# Patient Record
Sex: Male | Born: 1965
Health system: Southern US, Community
[De-identification: ages and names within clinical notes are randomized; demographics above are authoritative.]

## PROBLEM LIST (undated history)

## (undated) DIAGNOSIS — I10 Essential (primary) hypertension: Secondary | ICD-10-CM

## (undated) HISTORY — PX: OTHER SURGICAL HISTORY: SHX169

## (undated) HISTORY — PX: ANKLE SURGERY: SHX546

## (undated) HISTORY — PX: HERNIA REPAIR: SHX51

---

## 2007-11-12 HISTORY — PX: ROTATOR CUFF REPAIR: SHX139

## 2008-04-15 ENCOUNTER — Ambulatory Visit: Payer: Self-pay | Admitting: Family Medicine

## 2008-06-02 ENCOUNTER — Ambulatory Visit (HOSPITAL_BASED_OUTPATIENT_CLINIC_OR_DEPARTMENT_OTHER): Admission: RE | Admit: 2008-06-02 | Discharge: 2008-06-03 | Payer: Self-pay | Admitting: Orthopedic Surgery

## 2011-03-26 NOTE — Op Note (Signed)
[name], Guzman                ACCOUNT NO.:  [id number]   MEDICAL RECORD NO.:  [id number]          PATIENT TYPE:  AMB   LOCATION:  DSC                          FACILITY:  [facility]   PHYSICIAN:  Katy Fitch. Sypher, M.D. DATE OF BIRTH:  1966/06/12   DATE OF PROCEDURE:  06/02/2008  DATE OF DISCHARGE:  06/03/2008                               OPERATIVE REPORT   PREOPERATIVE DIAGNOSES:  Chronic pain, right shoulder with MRI evidence  of acromioclavicular joint arthropathy, edema of distal clavicle, and  significant subscapularis tendinopathy.   POSTOPERATIVE DIAGNOSES:  Type 1 superior labrum anterior and posterior  lesion with some undermining of insertion of long head of biceps at  superior labrum, grade 3 subscapularis degenerative tear with  retraction, acromioclavicular joint degenerative arthritis, and signs of  chronic stage II impingement, right shoulder.   OPERATION:  1. Examination of right shoulder under anesthesia.  2. Diagnostic arthroscopy, right shoulder with arthroscopic      debridement of type 1 superior labrum anterior and posterior lesion      and decortication of superior glenoid to facilitate healing of      minor instability of superior labrum/biceps origin.  3. Arthroscopic repair of subscapularis grade 3 tear with      decortication of lesser tuberosity and placement of a 5.5-mm PEEK      Bio-Corkscrew anchor utilizing double mattress technique.  4. Subacromial decompression with bursectomy, coracoacromial ligament      relaxation, and acromioplasty.  5. Arthroscopic distal clavicle resection.   OPERATIONS:  Katy Fitch. Sypher, MD   ASSISTANT:  Marveen Reeks Dasnoit, PA-C   ANESTHESIA:  General by LMA, supplemented by right interscalene block.   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   He has had a long career of heavy lifting.   He has had a chronic history of  right shoulder pain.  He was evaluated  by his primary care physician, Dr. Sharlot Gowda who is an expert in  primary care sports medicine.  Dr. Susann Givens noted evidence of [facility]  degenerative arthritis and sent him for an MRI.  The MRI demonstrated  significant subscapularis tendinopathy.  Mark Guzman was subsequently  referred for an upper extremity orthopedic consult in June 2009.  Clinical examination revealed signs of impingement, AC arthropathy, and  discomfort with stressing of the rotator cuff, particularly with  internal rotation.   The MRI was reviewed and was notable for tendinopathy without evidence  of retracted rotator cuff tear.  Our initial impression was significant  AC arthropathy with impingement.  We advised proceeding with diagnostic  arthroscopy followed by appropriate repairs.   Preoperatively, Mark Guzman was prepared for possible rotator cuff  repair, should we find evidence of a retracted tear.   He was seen in consultation by Dr. Gypsy Balsam in the holding area and  subsequently had an interscalene block placed on the right without  complication.  Excellent anesthesia of the right arm and forequarter  were achieved.  He subsequently completed informed consent.  He  was  brought to room #1, placed in supine position on the operating table,  and under Dr. Burnett Corrente strict supervision, general endotracheal  anesthesia induced.  He was carefully positioned in a beach-chair  position with aid of a torso and head holder designed for shoulder  arthroscopy.  The right arm was prepped with DuraPrep and draped with  impervious arthroscopy drapes.  The scope was introduced through a  standard posterior viewing portal using a switching stick from anterior.  Diagnostic arthroscopy confirmed a type 1 SLAP lesion and some  instability at the biceps origin.  The subscapularis was noted to have a  grade 3 tear with considerable synovitis and fibular degeneration of the  superior 70% of the  tendon.  There was a comma sign noted.   The deep surface of the supraspinatus, infraspinatus, and teres minor  were examined and found to be fundamentally intact.  The long head of  biceps had minor synovitis at the rotator interval and in the bicipital  groove.   An anterior portal was created under direct vision followed by use of a  4.5-mm suction shaver to debride the labrum and to decorticate the  superior glenoid to facilitate healing of the labrum to the glenoid.  We  did not perform a formal SLAP repair as this was a grade 1+ SLAP lesion.  The peel-back test was marginal.   The subscapularis was subsequently prepared for repair by creation of an  anterolateral portal, use of a suction shaver to debride to a smooth  tendon margin.  The lesser tuberosity was decorticated with a suction  shaver followed by placement of a 5.5-mm PEEK Bio-Corkscrew anchor at  the superior aspect of the subscap.   Two mattress sutures were placed with a Scorpion suture passer followed  by internal rotation of the arm and careful tying of the tendon with a  sixth finger knot passer.  There was some challenge to get a strong  repair due to the fibular degeneration of the tendon, however,  ultimately excellent approximation to the decorticated tuberosity was  achieved.   The scope was then removed from the glenohumeral joint and placed in the  subacromial space.  A considerable degree of bursitis was noted.  The [facility]  joint was prominent.  After debridement of bursa and tenolysis of the  cuff, the acromion was leveled to a type 1 morphology.  The  coracoacromial ligament was relaxed and electrocauterized, particularly  at the acromial branch.  The capsule of the [facility] joint was resected  followed by resection of the distal 15-mm clavicle with the arthroscopic  bur.   There were no apparent complications.  After hemostasis was achieved,  the subacromial space was thoroughly debrided with a suction  shaver  followed by removal of the arthroscopic equipment.  The portals were  repaired with mattress suture of 3-0 Prolene.  Mark Guzman was placed in  a sling and transferred to the recovery room with stable signs.  He will  be admitted to [facility] overnight due to the placement of anchors  and the rotator cuff repair.  There were no apparent complications.      Katy Fitch Sypher, M.D.  Electronically Signed    RVS/MEDQ  D:  06/02/2008  T:  06/03/2008  Job:  [id number]   cc:   Sharlot Gowda, M.D.

## 2011-08-09 LAB — POCT HEMOGLOBIN-HEMACUE: Hemoglobin: 17.2 — ABNORMAL HIGH

## 2014-03-18 ENCOUNTER — Encounter: Payer: Self-pay | Admitting: *Deleted

## 2015-07-10 ENCOUNTER — Emergency Department (HOSPITAL_COMMUNITY)
Admission: EM | Admit: 2015-07-10 | Discharge: 2015-07-10 | Disposition: A | Discharge status: Home / Self Care | Payer: BLUE CROSS/BLUE SHIELD | Attending: Emergency Medicine | Admitting: Emergency Medicine

## 2015-07-10 ENCOUNTER — Encounter (HOSPITAL_COMMUNITY): Payer: Self-pay | Admitting: *Deleted

## 2015-07-10 DIAGNOSIS — J302 Other seasonal allergic rhinitis: Secondary | ICD-10-CM | POA: Diagnosis not present

## 2015-07-10 DIAGNOSIS — Z720 Tobacco use: Secondary | ICD-10-CM | POA: Diagnosis not present

## 2015-07-10 DIAGNOSIS — R0981 Nasal congestion: Secondary | ICD-10-CM | POA: Diagnosis present

## 2015-07-10 DIAGNOSIS — J309 Allergic rhinitis, unspecified: Secondary | ICD-10-CM

## 2015-07-10 DIAGNOSIS — J9801 Acute bronchospasm: Secondary | ICD-10-CM | POA: Insufficient documentation

## 2015-07-10 MED ORDER — PREDNISONE 50 MG PO TABS
60.0000 mg | ORAL_TABLET | Freq: Once | ORAL | Status: AC
  Administered 2015-07-10: 60 mg via ORAL
  Filled 2015-07-10 (×2): qty 1

## 2015-07-10 MED ORDER — MOMETASONE FUROATE 50 MCG/ACT NA SUSP: 4.0000 | Freq: Every day | NASAL | Status: DC

## 2015-07-10 MED ORDER — PREDNISONE 50 MG PO TABS: 50.0000 mg | ORAL_TABLET | Freq: Every day | ORAL | Status: DC

## 2015-07-10 MED ORDER — IPRATROPIUM-ALBUTEROL 0.5-2.5 (3) MG/3ML IN SOLN
3.0000 mL | Freq: Once | RESPIRATORY_TRACT | Status: AC
  Administered 2015-07-10: 3 mL via RESPIRATORY_TRACT
  Filled 2015-07-10: qty 3

## 2015-07-10 MED ORDER — ALBUTEROL SULFATE HFA 108 (90 BASE) MCG/ACT IN AERS: 2.0000 | INHALATION_SPRAY | RESPIRATORY_TRACT | Status: DC | PRN

## 2015-07-10 NOTE — Discharge Instructions (Signed)
Use Afrin nasal spray as needed, but do not use for more than three days. Take Claritin or Zyrtec once a day.  Allergic Rhinitis Allergic rhinitis is when the mucous membranes in the nose respond to allergens. Allergens are particles in the air that cause your body to have an allergic reaction. This causes you to release allergic antibodies. Through a chain of events, these eventually cause you to release histamine into the blood stream. Although meant to protect the body, it is this release of histamine that causes your discomfort, such as frequent sneezing, congestion, and an itchy, runny nose.  CAUSES  Seasonal allergic rhinitis (hay fever) is caused by pollen allergens that may come from grasses, trees, and weeds. Year-round allergic rhinitis (perennial allergic rhinitis) is caused by allergens such as house dust mites, pet dander, and mold spores.  SYMPTOMS   Nasal stuffiness (congestion).  Itchy, runny nose with sneezing and tearing of the eyes. DIAGNOSIS  Your health care provider can help you determine the allergen or allergens that trigger your symptoms. If you and your health care provider are unable to determine the allergen, skin or blood testing may be used. TREATMENT  Allergic rhinitis does not have a cure, but it can be controlled by:  Medicines and allergy shots (immunotherapy).  Avoiding the allergen. Hay fever may often be treated with antihistamines in pill or nasal spray forms. Antihistamines block the effects of histamine. There are over-the-counter medicines that may help with nasal congestion and swelling around the eyes. Check with your health care provider before taking or giving this medicine.  If avoiding the allergen or the medicine prescribed do not work, there are many new medicines your health care provider can prescribe. Stronger medicine may be used if initial measures are ineffective. Desensitizing injections can be used if medicine and avoidance does not work.  Desensitization is when a patient is given ongoing shots until the body becomes less sensitive to the allergen. Make sure you follow up with your health care provider if problems continue. HOME CARE INSTRUCTIONS It is not possible to completely avoid allergens, but you can reduce your symptoms by taking steps to limit your exposure to them. It helps to know exactly what you are allergic to so that you can avoid your specific triggers. SEEK MEDICAL CARE IF:   You have a fever.  You develop a cough that does not stop easily (persistent).  You have shortness of breath.  You start wheezing.  Symptoms interfere with normal daily activities. Document Released: 07/23/2001 Document Revised: 11/02/2013 Document Reviewed: 07/05/2013 [facility] Patient Information 2015 [place], [place]. This information is not intended to replace advice given to you by your health care provider. Make sure you discuss any questions you have with your health care provider.  Bronchospasm A bronchospasm is a spasm or tightening of the airways going into the lungs. During a bronchospasm breathing becomes more difficult because the airways get smaller. When this happens there can be coughing, a whistling sound when breathing (wheezing), and difficulty breathing. Bronchospasm is often associated with asthma, but not all patients who experience a bronchospasm have asthma. CAUSES  A bronchospasm is caused by inflammation or irritation of the airways. The inflammation or irritation may be triggered by:   Allergies (such as to animals, pollen, food, or mold). Allergens that cause bronchospasm may cause wheezing immediately after exposure or many hours later.   Infection. Viral infections are believed to be the most common cause of bronchospasm.   Exercise.  Irritants (such as pollution, cigarette smoke, strong odors, aerosol sprays, and paint fumes).   Weather changes. Winds increase molds and pollens in the air. Rain  refreshes the air by washing irritants out. Cold air may cause inflammation.   Stress and emotional upset.  SIGNS AND SYMPTOMS   Wheezing.   Excessive nighttime coughing.   Frequent or severe coughing with a simple cold.   Chest tightness.   Shortness of breath.  DIAGNOSIS  Bronchospasm is usually diagnosed through a history and physical exam. Tests, such as chest X-rays, are sometimes done to look for other conditions. TREATMENT   Inhaled medicines can be given to open up your airways and help you breathe. The medicines can be given using either an inhaler or a nebulizer machine.  Corticosteroid medicines may be given for severe bronchospasm, usually when it is associated with asthma. HOME CARE INSTRUCTIONS   Always have a plan prepared for seeking medical care. Know when to call your health care provider and local emergency services (911 in the [place] Know where you can access local emergency care.  Only take medicines as directed by your health care provider.  If you were prescribed an inhaler or nebulizer machine, ask your health care provider to explain how to use it correctly. Always use a spacer with your inhaler if you were given one.  It is necessary to remain calm during an attack. Try to relax and breathe more slowly.  Control your home environment in the following ways:   Change your heating and air conditioning filter at least once a month.   Limit your use of fireplaces and wood stoves.  Do not smoke and do not allow smoking in your home.   Avoid exposure to perfumes and fragrances.   Get rid of pests (such as roaches and mice) and their droppings.   Throw away plants if you see mold on them.   Keep your house clean and dust free.   Replace carpet with wood, tile, or vinyl flooring. Carpet can trap dander and dust.   Use allergy-proof pillows, mattress covers, and box spring covers.   Wash bed sheets and blankets every week in hot  water and dry them in a dryer.   Use blankets that are made of polyester or cotton.   Wash hands frequently. SEEK MEDICAL CARE IF:   You have muscle aches.   You have chest pain.   The sputum changes from clear or white to yellow, green, gray, or bloody.   The sputum you cough up gets thicker.   There are problems that may be related to the medicine you are given, such as a rash, itching, swelling, or trouble breathing.  SEEK IMMEDIATE MEDICAL CARE IF:   You have worsening wheezing and coughing even after taking your prescribed medicines.   You have increased difficulty breathing.   You develop severe chest pain. MAKE SURE YOU:   Understand these instructions.  Will watch your condition.  Will get help right away if you are not doing well or get worse. Document Released: 10/31/2003 Document Revised: 11/02/2013 Document Reviewed: 04/19/2013 [facility] Patient Information 2015 [place], [place]. This information is not intended to replace advice given to you by your health care provider. Make sure you discuss any questions you have with your health care provider.  Prednisolone tablets What is this medicine? PREDNISOLONE (pred NISS oh lone) is a corticosteroid. It is commonly used to treat inflammation of the skin, joints, lungs, and other organs. Common conditions treated  include asthma, allergies, and arthritis. It is also used for other conditions, such as blood disorders and diseases of the adrenal glands. This medicine may be used for other purposes; ask your health care provider or pharmacist if you have questions. COMMON BRAND NAME(S): Millipred, Millipred DP, Millipred DP 12-Day, Millipred DP 6 Day, Prednoral What should I tell my health care provider before I take this medicine? They need to know if you have any of these conditions: -Cushing's syndrome -diabetes -glaucoma -heart problems or disease -high blood pressure -infection such as herpes, measles,  tuberculosis, or chickenpox -kidney disease -liver disease -mental problems -myasthenia gravis -osteoporosis -seizures -stomach ulcer or intestine disease including colitis and diverticulitis -thyroid problem -an unusual or allergic reaction to lactose, prednisolone, other medicines, foods, dyes, or preservatives -pregnant or trying to get pregnant -breast-feeding How should I use this medicine? Take this medicine by mouth with a glass of water. Follow the directions on the prescription label. Take it with food or milk to avoid stomach upset. If you are taking this medicine once a day, take it in the morning. Do not take more medicine than you are told to take. Do not suddenly stop taking your medicine because you may develop a severe reaction. Your doctor will tell you how much medicine to take. If your doctor wants you to stop the medicine, the dose may be slowly lowered over time to avoid any side effects. Talk to your pediatrician regarding the use of this medicine in children. Special care may be needed. Overdosage: If you think you have taken too much of this medicine contact a poison control center or emergency room at once. NOTE: This medicine is only for you. Do not share this medicine with others. What if I miss a dose? If you miss a dose, take it a soon as you can. If it is almost time for your next dose, talk to your doctor or health care professional. You may need to miss a dose or take an extra dose. Do not take double or extra doses without advice. What may interact with this medicine? Do not take this medicine with any of the following medications: -mifepristone This medicine may also interact with the following medications: -aspirin -phenobarbital -phenytoin -rifampin -vaccines -warfarin This list may not describe all possible interactions. Give your health care provider a list of all the medicines, herbs, non-prescription drugs, or dietary supplements you use. Also tell  them if you smoke, drink alcohol, or use illegal drugs. Some items may interact with your medicine. What should I watch for while using this medicine? Visit your doctor or health care professional for regular checks on your progress. If you are taking this medicine over a prolonged period, carry an identification card with your name and address, the type and dose of your medicine, and your doctor's name and address. The medicine may increase your risk of getting an infection. Stay away from people who are sick. Tell your doctor or health care professional if you are around anyone with measles or chickenpox. If you are going to have surgery, tell your doctor or health care professional that you have taken this medicine within the last twelve months. Ask your doctor or health care professional about your diet. You may need to lower the amount of salt you eat. The medicine can increase your blood sugar. If you are a diabetic check with your doctor if you need help adjusting the dose of your diabetic medicine. What side effects may I notice  from receiving this medicine? Side effects that you should report to your doctor or health care professional as soon as possible: -eye pain, decreased or blurred vision, or bulging eyes -fever, sore throat, sneezing, cough, or other signs of infection, wounds that will not heal -frequent passing of urine -increased thirst -mental depression, mood swings, mistaken feelings of self importance or of being mistreated -pain in hips, back, ribs, arms, shoulders, or legs -swelling of feet or lower legs Side effects that usually do not require medical attention (report to your doctor or health care professional if they continue or are bothersome): -confusion, excitement, restlessness -headache -nausea, vomiting -skin problems, acne, thin and shiny skin -weight gain This list may not describe all possible side effects. Call your doctor for medical advice about side  effects. You may report side effects to FDA at 1-800-FDA-1088. Where should I keep my medicine? Keep out of the reach of children. Store at room temperature between 15 and 30 degrees C (59 and 86 degrees F). Keep container tightly closed. Throw away any unused medicine after the expiration date. NOTE: This sheet is a summary. It may not cover all possible information. If you have questions about this medicine, talk to your doctor, pharmacist, or health care provider.  2015, [employer] Standard. (2012-07-28 11:43:16)  Albuterol inhalation aerosol What is this medicine? ALBUTEROL (al [place]) is a bronchodilator. It helps open up the airways in your lungs to make it easier to breathe. This medicine is used to treat and to prevent bronchospasm. This medicine may be used for other purposes; ask your health care provider or pharmacist if you have questions. COMMON BRAND NAME(S): Proair HFA, Proventil, Proventil HFA, Respirol, Ventolin, Ventolin HFA What should I tell my health care provider before I take this medicine? They need to know if you have any of the following conditions: -diabetes -heart disease or irregular heartbeat -high blood pressure -pheochromocytoma -seizures -thyroid disease -an unusual or allergic reaction to albuterol, levalbuterol, sulfites, other medicines, foods, dyes, or preservatives -pregnant or trying to get pregnant -breast-feeding How should I use this medicine? This medicine is for inhalation through the mouth. Follow the directions on your prescription label. Take your medicine at regular intervals. Do not use more often than directed. Make sure that you are using your inhaler correctly. Ask you doctor or health care provider if you have any questions. Talk to your pediatrician regarding the use of this medicine in children. Special care may be needed. Overdosage: If you think you have taken too much of this medicine contact a poison control center or  emergency room at once. NOTE: This medicine is only for you. Do not share this medicine with others. What if I miss a dose? If you miss a dose, use it as soon as you can. If it is almost time for your next dose, use only that dose. Do not use double or extra doses. What may interact with this medicine? -anti-infectives like chloroquine and pentamidine -caffeine -cisapride -diuretics -medicines for colds -medicines for depression or for emotional or psychotic conditions -medicines for weight loss including some herbal products -methadone -some antibiotics like clarithromycin, erythromycin, levofloxacin, and linezolid -some heart medicines -steroid hormones like dexamethasone, cortisone, hydrocortisone -theophylline -thyroid hormones This list may not describe all possible interactions. Give your health care provider a list of all the medicines, herbs, non-prescription drugs, or dietary supplements you use. Also tell them if you smoke, drink alcohol, or use illegal drugs. Some items may interact with your  medicine. What should I watch for while using this medicine? Tell your doctor or health care professional if your symptoms do not improve. Do not use extra albuterol. If your asthma or bronchitis gets worse while you are using this medicine, call your doctor right away. If your mouth gets dry try chewing sugarless gum or sucking hard candy. Drink water as directed. What side effects may I notice from receiving this medicine? Side effects that you should report to your doctor or health care professional as soon as possible: -allergic reactions like skin rash, itching or hives, swelling of the face, lips, or tongue -breathing problems -chest pain -feeling faint or lightheaded, falls -high blood pressure -irregular heartbeat -fever -muscle cramps or weakness -pain, tingling, numbness in the hands or feet -vomiting Side effects that usually do not require medical attention (report to  your doctor or health care professional if they continue or are bothersome): -cough -difficulty sleeping -headache -nervousness or trembling -stomach upset -stuffy or runny nose -throat irritation -unusual taste This list may not describe all possible side effects. Call your doctor for medical advice about side effects. You may report side effects to FDA at 1-800-FDA-1088. Where should I keep my medicine? Keep out of the reach of children. Store at room temperature between 15 and 30 degrees C (59 and 86 degrees F). The contents are under pressure and may burst when exposed to heat or flame. Do not freeze. This medicine does not work as well if it is too cold. Throw away any unused medicine after the expiration date. Inhalers need to be thrown away after the labeled number of puffs have been used or by the expiration date; whichever comes first. Ventolin HFA should be thrown away 12 months after removing from foil pouch. Check the instructions that come with your medicine. NOTE: This sheet is a summary. It may not cover all possible information. If you have questions about this medicine, talk to your doctor, pharmacist, or health care provider.  2015, [employer] Standard. (2013-04-15 10:57:17)  Mometasone nasal spray What is this medicine? MOMETASONE(moe MET a sone) is a corticosteroid. It helps decrease inflammation in your nose. This medicine is used to treat the symptoms of allergies like sneezing, itching, and runny or stuffy nose. This medicine is also used to treat nasal polyps. This medicine may be used for other purposes; ask your health care provider or pharmacist if you have questions. COMMON BRAND NAME(S): Nasonex What should I tell my health care provider before I take this medicine? They need to know if you have any of these conditions: -infection, like tuberculosis, herpes, or fungal infection -recent surgery or injury of nose or sinuses -taking corticosteroids by mouth -an  unusual or allergic reaction to mometasone, other corticosteroids, medicines, foods, dyes, or preservatives -pregnant or trying to get pregnant -breast-feeding How should I use this medicine? This medicine is for use in the nose. Follow the directions on your prescription label. Do not use more often than directed. Do not share this medicine with anyone else. Make sure that you are using your nasal spray correctly. Ask you doctor or health care provider if you have any questions. Talk to your pediatrician regarding the use of this medicine in children. While this drug may be prescribed for children as young as 67 years of age for selected conditions, precautions do apply. Overdosage: If you think you have taken too much of this medicine contact a poison control center or emergency room at once. NOTE: This medicine is  only for you. Do not share this medicine with others. What if I miss a dose? If you miss a dose, use it as soon as you can. If it is almost time for your next dose, use only that dose and continue with your regular schedule. Do not use double or extra doses. What may interact with this medicine? There are no known drug interactions. Check with your doctor before you use any other medicine for your nose or sinus. This list may not describe all possible interactions. Give your health care provider a list of all the medicines, herbs, non-prescription drugs, or dietary supplements you use. Also tell them if you smoke, drink alcohol, or use illegal drugs. Some items may interact with your medicine. What should I watch for while using this medicine? Visit your doctor for regular check ups. Tell your doctor or healthcare professional if your symptoms do not start to get better or if they get worse. This medicine may increase your risk of getting an infection. Tell your doctor or health care professional if you are around anyone with measles or chickenpox, or if you develop sores or blisters that  do not heal properly. What side effects may I notice from receiving this medicine? Side effects that you should report to your doctor or health care professional as soon as possible: -allergic reactions like skin rash, itching or hives, swelling of the face, lips, or tongue -breathing problems -changes in vision -feeling faint or lightheaded, falls -infection -nausea, vomiting -unusually weak or tired -white patches or sores in the mouth or nose Side effects that usually do not require medical attention (report to your doctor or health care professional if they continue or are bothersome): -altered sense of taste or smell -burning or irritation inside the nose or throat -cough -headache -muscle pain -painful menstrual periods -nosebleed This list may not describe all possible side effects. Call your doctor for medical advice about side effects. You may report side effects to FDA at 1-800-FDA-1088. Where should I keep my medicine? Keep out of the reach of children. Store this medicine at room temperature between 59 and 86 degrees F (15 and 30 degrees C). Protect from light. Throw away any unused medicine after the expiration date. NOTE: This sheet is a summary. It may not cover all possible information. If you have questions about this medicine, talk to your doctor, pharmacist, or health care provider.  2015, [employer] Standard. (2012-07-10 16:03:26)

## 2015-07-10 NOTE — ED Provider Notes (Signed)
CSN: [id number]     Arrival date & time 07/10/15  6195 History   First MD Initiated Contact with Patient 07/10/15 [phone]     Chief Complaint  Patient presents with  . Nasal Congestion     (Consider location/radiation/quality/duration/timing/severity/associated sxs/prior Treatment) The history is provided by the patient.   49 year old male has had a sinus infection for the last week. He describes severe nasal congestion to the point where he cannot breathe through his nose. He does blow out yellow mucus. He denies fever, chills, sweats. He denies any cough. Tonight, he woke up feeling like he couldn't breathe. Even when he threw his mouth, he felt like he was not getting enough air. He denies arthralgias or myalgias. He has tried a variety of over-the-counter medications without any relief.   History reviewed. No pertinent past medical history. Past Surgical History  Procedure Laterality Date  . Rotator cuff repair Right 2009    Dr. Daylene Katayama  . Hernia repair      49 years old   Family History  Problem Relation Age of Onset  . Arthritis Father    Social History  Substance Use Topics  . Smoking status: Current Every Day Smoker -- 0.25 packs/day    Types: Cigarettes  . Smokeless tobacco: Never Used  . Alcohol Use: 7.2 oz/week    12 Cans of beer per week    Review of Systems  All other systems reviewed and are negative.     Allergies  Review of patient's allergies indicates no known allergies.  Home Medications   Prior to Admission medications   Medication Sig Start Date End Date Taking? Authorizing Provider  multivitamin-iron-minerals-folic acid (CENTRUM) chewable tablet Chew 1 tablet by mouth daily.   Yes Historical Provider, MD   BP 134/94 mmHg  Pulse 64  Temp(Src) 97.5 F (36.4 C) (Oral)  Resp 16  Ht 5\' 11"  (1.803 m)  Wt 200 lb (90.719 kg)  BMI 27.91 kg/m2  SpO2 100% Physical Exam  Nursing note and vitals reviewed.  49 year old male, resting comfortably and in  no acute distress. Vital signs are significant for hypertension. Oxygen saturation is 100%, which is normal. Head is normocephalic and atraumatic. PERRLA, EOMI. Oropharynx is clear. There is edema and some clear mucoid drainage of the turbinates on the left. There is no drainage or edema of the turbinates on the right. No religious drainage is seen. There is no sinus tenderness. Neck is nontender and supple without adenopathy or JVD. Back is nontender and there is no CVA tenderness. Lungs are clear without rales, wheezes, or rhonchi with tidal breathing. Slight wheezing noted on forced exhalation. Chest is nontender. Heart has regular rate and rhythm without murmur. Abdomen is soft, flat, nontender without masses or hepatosplenomegaly and peristalsis is normoactive. Extremities have no cyanosis or edema, full range of motion is present. Skin is warm and dry without rash. Neurologic: Mental status is normal, cranial nerves are intact, there are no motor or sensory deficits.  ED Course  Procedures (including critical care time)  MDM   Final diagnoses:  Allergic rhinitis, unspecified allergic rhinitis type  Bronchospasm    Upper respiratory infection which is most likely viral, possibly allergic. Slight evidence of possible reactive airways. He will be given a therapeutic trial of albuterol with ipratropium.  He had good relief of dyspnea with above noted treatment. He is discharged with prescription for albuterol inhaler and prednisone as well as mometasone nasal spray. He is advised to use over-the-counter  Claritin or Zyrtec once a day. Also advised that he may use Afrin nasal spray as long as he does not use it for more than 3 days. He is referred to ENT for follow-up.  Delora Fuel, MD 44/92/01 0071

## 2015-07-10 NOTE — ED Notes (Signed)
Pt c/o nasal congestion that started a week ago and has gotten worse, denies any fever,

## 2015-09-25 ENCOUNTER — Ambulatory Visit (INDEPENDENT_AMBULATORY_CARE_PROVIDER_SITE_OTHER): Payer: BLUE CROSS/BLUE SHIELD | Admitting: Physician Assistant

## 2015-09-25 ENCOUNTER — Encounter: Payer: Self-pay | Admitting: Physician Assistant

## 2015-09-25 VITALS — BP 116/86 | HR 88 | Temp 98.9°F | Resp 18 | Ht 70.0 in | Wt 201.0 lb

## 2015-09-25 DIAGNOSIS — Z0000 Encounter for general adult medical examination without abnormal findings: Secondary | ICD-10-CM | POA: Diagnosis not present

## 2015-09-25 DIAGNOSIS — G4700 Insomnia, unspecified: Secondary | ICD-10-CM | POA: Insufficient documentation

## 2015-09-25 LAB — LIPID PANEL
Cholesterol: 181 mg/dL (ref 125–200)
HDL: 47 mg/dL (ref >=40)
LDL Cholesterol: 119 mg/dL (ref <130)
Total CHOL/HDL Ratio: 3.9 Ratio (ref <=5.0)
Triglycerides: 74 mg/dL (ref <150)
VLDL: 15 mg/dL (ref <30)

## 2015-09-25 LAB — COMPLETE METABOLIC PANEL WITH GFR
ALT: 33 U/L (ref 9–46)
AST: 24 U/L (ref 10–40)
Albumin: 4.3 g/dL (ref 3.6–5.1)
Alkaline Phosphatase: 70 U/L (ref 40–115)
BUN: 19 mg/dL (ref 7–25)
CO2: 24 mmol/L (ref 20–31)
Calcium: 9.5 mg/dL (ref 8.6–10.3)
Chloride: 104 mmol/L (ref 98–110)
Creat: 1.15 mg/dL (ref 0.60–1.35)
GFR, Est African American: 86 mL/min (ref >=60)
GFR, Est Non African American: 74 mL/min (ref >=60)
Glucose, Bld: 99 mg/dL (ref 70–99)
Potassium: 4.7 mmol/L (ref 3.5–5.3)
Sodium: 141 mmol/L (ref 135–146)
Total Bilirubin: 0.5 mg/dL (ref 0.2–1.2)
Total Protein: 6.4 g/dL (ref 6.1–8.1)

## 2015-09-25 LAB — CBC WITH DIFFERENTIAL/PLATELET
Basophils Absolute: 0.1 10*3/uL (ref 0.0–0.1)
Basophils Relative: 1 % (ref 0–1)
Eosinophils Absolute: 0.1 10*3/uL (ref 0.0–0.7)
Eosinophils Relative: 2 % (ref 0–5)
HCT: 45.9 % (ref 39.0–52.0)
Hemoglobin: 16.1 g/dL (ref 13.0–17.0)
Lymphocytes Relative: 23 % (ref 12–46)
Lymphs Abs: 1.5 10*3/uL (ref 0.7–4.0)
MCH: 32.3 pg (ref 26.0–34.0)
MCHC: 35.1 g/dL (ref 30.0–36.0)
MCV: 92.2 fL (ref 78.0–100.0)
MPV: 9.2 fL (ref 8.6–12.4)
Monocytes Absolute: 0.5 10*3/uL (ref 0.1–1.0)
Monocytes Relative: 8 % (ref 3–12)
Neutro Abs: 4.3 10*3/uL (ref 1.7–7.7)
Neutrophils Relative %: 66 % (ref 43–77)
Platelets: 243 10*3/uL (ref 150–400)
RBC: 4.98 MIL/uL (ref 4.22–5.81)
RDW: 13 % (ref 11.5–15.5)
WBC: 6.5 10*3/uL (ref 4.0–10.5)

## 2015-09-25 LAB — TSH: TSH: 0.704 u[IU]/mL (ref 0.350–4.500)

## 2015-09-25 MED ORDER — ZOLPIDEM TARTRATE ER 12.5 MG PO TBCR: 12.5000 mg | EXTENDED_RELEASE_TABLET | Freq: Every evening | ORAL | Status: DC | PRN

## 2015-09-25 NOTE — Progress Notes (Signed)
Patient ID: Mark Guzman MRN: [id number], DOB: 1966/02/17, 49 y.o. Date of Encounter: 09/25/2015, 9:27 AM    Chief Complaint:  Chief Complaint  Patient presents with  . knot on head, getting bigger    is fasting, also one on leg, trouble with sleeping     HPI: 49 y.o. year old white male is here with his wife for office visit today. She states that she is a patient of  Dr. Dennard Guzman. She states that her husband has not had a PCP and he really needs to have a PCP and needs to have preventive care. 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 Therefore has gotten him to schedule appointment with Dr. Dennard Guzman. 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 He is off work this week so wanted to come in and go ahead and evaluate some of these problems today and then already has a physical scheduled with Dr. Dennard Guzman for December 2.  Also is fasting so that he can go ahead and do his labs today to prepare for his complete physical exam with Dr. Dennard Guzman December 2.  Has noticed these "knots" on his right forehead for about 4 or 5 years.  Says that he wanted to have them checked "to make sure they're nothing to worry about."  He and his wife state that he has no problem falling asleep and in fact he "crashes "at 8 PM every night. However at about midnight he wakes up and is wide awake and cannot get back to sleep. 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 However says sometimes he has to leave at 2:30 a.m. but mostly he gets up around 4 or 4:30 AM. Says that he has gone to sleep at 8 PM every night for as long as he can remember. They're concerned about him driving when he is sleepy after getting just 4 hours of sleep.     Home Meds:   Outpatient Prescriptions Prior to Visit  Medication Sig Dispense Refill  . albuterol (PROVENTIL HFA;VENTOLIN HFA) 108 (90  BASE) MCG/ACT inhaler Inhale 2 puffs into the lungs every 4 (four) hours as needed for wheezing or shortness of breath (or coughing). 1 Inhaler 0  . mometasone (NASONEX) 50 MCG/ACT nasal spray Place 4 sprays into the nose daily. Two sprays in each nostril 17 g 12  . multivitamin-iron-minerals-folic acid (CENTRUM) chewable tablet Chew 1 tablet by mouth daily.    . predniSONE (DELTASONE) 50 MG tablet Take 1 tablet (50 mg total) by mouth daily. 5 tablet 0   No facility-administered medications prior to visit.    Allergies: No Known Allergies    Review of Systems: See HPI for pertinent ROS. All other ROS negative.    Physical Exam: Blood pressure 116/86, pulse 88, temperature 98.9 F (37.2 C), temperature source Oral, resp. rate 18, height 5\' 10"  (1.778 m), weight 201 lb (91.173 kg)., Body mass index is 28.84 kg/(m^2). General:  WNWD WM. Appears in no acute distress. Neck: Supple. No thyromegaly. No lymphadenopathy. Lungs: Clear bilaterally to auscultation without wheezes, rales, or rhonchi. Breathing is unlabored. Heart: Regular rhythm. No murmurs, rubs, or gallops. Msk:  Strength and tone normal for age. Skin: Right Forehead: approx 1 cm diameter lesion--raised approx 2 mm--soft. Color is same as other skin. No erythema.  Neuro: Alert and  oriented X 3. Moves all extremities spontaneously. Gait is normal. CNII-XII grossly in tact. Psych:  Responds to questions appropriately with a normal affect.     ASSESSMENT AND PLAN:  49 y.o. year old male with  1. Insomnia Discussed possible side effects with this medication. If he has side effects then he needs to call us so that we can change it to a different medication. - zolpidem (AMBIEN CR) 12.5 MG CR tablet; Take 1 tablet (12.5 mg total) by mouth at bedtime as needed for sleep.  Dispense: 30 tablet; Refill: 2  Reassured him that lesion on forehead consistent with cyst which is benign  Visit for preventive health examination----  Scheduled  with Dr. Dennard Guzman for December 2. [name] so we'll check labs for his upcoming physical now. - CBC with Differential/Platelet - COMPLETE METABOLIC PANEL WITH GFR - Lipid panel - TSH - VITAMIN D 25 Hydroxy (Vit-D Deficiency, Fractures)   Signed, Karis Juba, [place], [facility] 09/25/2015 9:27 AM

## 2015-09-26 LAB — VITAMIN D 25 HYDROXY (VIT D DEFICIENCY, FRACTURES): Vit D, 25-Hydroxy: 33 ng/mL (ref 30–100)

## 2015-09-27 ENCOUNTER — Encounter: Payer: Self-pay | Admitting: Family Medicine

## 2015-10-13 ENCOUNTER — Ambulatory Visit (INDEPENDENT_AMBULATORY_CARE_PROVIDER_SITE_OTHER): Payer: BLUE CROSS/BLUE SHIELD | Admitting: Family Medicine

## 2015-10-13 ENCOUNTER — Encounter: Payer: Self-pay | Admitting: Family Medicine

## 2015-10-13 VITALS — BP 130/94 | HR 92 | Temp 98.8°F | Resp 18 | Ht 70.0 in | Wt 200.0 lb

## 2015-10-13 DIAGNOSIS — I781 Nevus, non-neoplastic: Secondary | ICD-10-CM

## 2015-10-13 DIAGNOSIS — G4700 Insomnia, unspecified: Secondary | ICD-10-CM

## 2015-10-13 MED ORDER — TRAZODONE HCL 50 MG PO TABS: 25.0000 mg | ORAL_TABLET | Freq: Every evening | ORAL | Status: DC | PRN

## 2015-10-13 NOTE — Progress Notes (Signed)
Subjective:    Patient ID: Mark Guzman, male    DOB: February 11, 1966, 49 y.o.   MRN: [id number]  HPI   patient was scheduled as a physical but he states he really doesn't want physical. Instead he wants a lesion removed on his right upper lip. There is a 5 mm skin colored papule on the right upper lip consistent with a dermal nevus. He states that that has been there for years and years and years. However it causes him a tremendous difficulty when he shaves. I did take time to review his most recent lab work with him which was excellent. He also complains of insomnia. He was given Ambien at the last visit but this was too strong. He experienced sleepwalking with no recollection on that medication. He would like to try something weaker.  Most recent labs are listed below: Office Visit on 09/25/2015  Component Date Value Ref Range Status  . WBC 09/25/2015 6.5  4.0 - 10.5 K/uL Final  . RBC 09/25/2015 4.98  4.22 - 5.81 MIL/uL Final  . Hemoglobin 09/25/2015 16.1  13.0 - 17.0 g/dL Final  . HCT 09/25/2015 45.9  39.0 - 52.0 % Final  . MCV 09/25/2015 92.2  78.0 - 100.0 fL Final  . MCH 09/25/2015 32.3  26.0 - 34.0 pg Final  . MCHC 09/25/2015 35.1  30.0 - 36.0 g/dL Final  . RDW 09/25/2015 13.0  11.5 - 15.5 % Final  . Platelets 09/25/2015 243  150 - 400 K/uL Final  . MPV 09/25/2015 9.2  8.6 - 12.4 fL Final  . Neutrophils Relative % 09/25/2015 66  43 - 77 % Final  . Neutro Abs 09/25/2015 4.3  1.7 - 7.7 K/uL Final  . Lymphocytes Relative 09/25/2015 23  12 - 46 % Final  . Lymphs Abs 09/25/2015 1.5  0.7 - 4.0 K/uL Final  . Monocytes Relative 09/25/2015 8  3 - 12 % Final  . Monocytes Absolute 09/25/2015 0.5  0.1 - 1.0 K/uL Final  . Eosinophils Relative 09/25/2015 2  0 - 5 % Final  . Eosinophils Absolute 09/25/2015 0.1  0.0 - 0.7 K/uL Final  . Basophils Relative 09/25/2015 1  0 - 1 % Final  . Basophils Absolute 09/25/2015 0.1  0.0 - 0.1 K/uL Final  . Smear Review 09/25/2015 Criteria for review not met    Final  . Sodium 09/25/2015 141  135 - 146 mmol/L Final  . Potassium 09/25/2015 4.7  3.5 - 5.3 mmol/L Final  . Chloride 09/25/2015 104  98 - 110 mmol/L Final  . CO2 09/25/2015 24  20 - 31 mmol/L Final  . Glucose, Bld 09/25/2015 99  70 - 99 mg/dL Final  . BUN 09/25/2015 19  7 - 25 mg/dL Final  . Creat 09/25/2015 1.15  0.60 - 1.35 mg/dL Final  . Total Bilirubin 09/25/2015 0.5  0.2 - 1.2 mg/dL Final  . Alkaline Phosphatase 09/25/2015 70  40 - 115 U/L Final  . AST 09/25/2015 24  10 - 40 U/L Final  . ALT 09/25/2015 33  9 - 46 U/L Final  . Total Protein 09/25/2015 6.4  6.1 - 8.1 g/dL Final  . Albumin 09/25/2015 4.3  3.6 - 5.1 g/dL Final  . Calcium 09/25/2015 9.5  8.6 - 10.3 mg/dL Final  . GFR, Est African American 09/25/2015 86  >=60 mL/min Final  . GFR, Est Non African American 09/25/2015 74  >=60 mL/min Final   Comment:   The estimated GFR is a calculation valid  for adults (>=43 years old) that uses the CKD-EPI algorithm to adjust for age and sex. It is   not to be used for children, pregnant women, hospitalized patients,    patients on dialysis, or with rapidly changing kidney function. According to the NKDEP, eGFR >89 is normal, 60-89 shows mild impairment, 30-59 shows moderate impairment, 15-29 shows severe impairment and <15 is ESRD.     Marland Kitchen Cholesterol 09/25/2015 181  125 - 200 mg/dL Final  . Triglycerides 09/25/2015 74  <150 mg/dL Final  . HDL 09/25/2015 47  >=40 mg/dL Final  . Total CHOL/HDL Ratio 09/25/2015 3.9  <=5.0 Ratio Final  . VLDL 09/25/2015 15  <30 mg/dL Final  . LDL Cholesterol 09/25/2015 119  <130 mg/dL Final   Comment:   Total Cholesterol/HDL Ratio:CHD Risk                        Coronary Heart Disease Risk Table                                        Men       Women          1/2 Average Risk              3.4        3.3              Average Risk              5.0        4.4           2X Average Risk              9.6        7.1           3X Average Risk              23.4       11.0 Use the calculated Patient Ratio above and the CHD Risk table  to determine the patient's CHD Risk.   Marland Kitchen TSH 09/25/2015 0.704  0.350 - 4.500 uIU/mL Final  . Vit D, 25-Hydroxy 09/25/2015 33  30 - 100 ng/mL Final   Comment: Vitamin D Status           25-OH Vitamin D        Deficiency                <20 ng/mL        Insufficiency         20 - 29 ng/mL        Optimal             > or = 30 ng/mL   For 25-OH Vitamin D testing on patients on D2-supplementation and patients for whom quantitation of D2 and D3 fractions is required, the QuestAssureD 25-OH VIT D, (D2,D3), LC/MS/MS is recommended: order code [id number] (patients > 2 yrs).    pmh-insomnia Current Outpatient Prescriptions on File Prior to Visit  Medication Sig Dispense Refill  . albuterol (PROVENTIL HFA;VENTOLIN HFA) 108 (90 BASE) MCG/ACT inhaler Inhale 2 puffs into the lungs every 4 (four) hours as needed for wheezing or shortness of breath (or coughing). 1 Inhaler 0  . mometasone (NASONEX) 50 MCG/ACT nasal spray Place 4 sprays into the nose daily. Two sprays in each nostril 17 g 12  . multivitamin-iron-minerals-folic  acid (CENTRUM) chewable tablet Chew 1 tablet by mouth daily.    Marland Kitchen zolpidem (AMBIEN CR) 12.5 MG CR tablet Take 1 tablet (12.5 mg total) by mouth at bedtime as needed for sleep. 30 tablet 2   No current facility-administered medications on file prior to visit.   No Known Allergies Social History   Social History  . Marital Status: Married    Spouse Name: N/A  . Number of Children: N/A  . Years of Education: N/A   Occupational History  . Not on file.   Social History Main Topics  . Smoking status: Current Every Day Smoker -- 0.25 packs/day    Types: E-cigarettes  . Smokeless tobacco: Never Used  . Alcohol Use: 7.2 oz/week    12 Cans of beer per week  . Drug Use: No  . Sexual Activity: Yes   Other Topics Concern  . Not on file   Social History Narrative     Review of Systems       Objective:   Physical Exam  Constitutional: He appears well-developed and well-nourished. No distress.  Eyes: Conjunctivae and EOM are normal. Pupils are equal, round, and reactive to light. Right eye exhibits no discharge. Left eye exhibits no discharge. No scleral icterus.  Cardiovascular: Normal rate and regular rhythm.   Pulmonary/Chest: Effort normal and breath sounds normal.  Skin: He is not diaphoretic.  Vitals reviewed.    5 mm dermal nevus on the right upper lip      Assessment & Plan:  Insomnia  Nevus, non-neoplastic   discontinue Ambien and start trazodone 50-100 mg by mouth daily at bedtime when necessary insomnia. I reviewed his lab work with him. He is not due for a colonoscopy until next year or prostate exam until next year. I explained to the patient I could remove the nevus on his upper lip but I cautioned him it would leave a scar. He is willing to accept that. He really wants the lesion on and he doesn't ant have to miss work again to go to see a Paediatric nurse. Therefore I anesthetized the lesion was 0.1% lidocaine with epinephrine. The patient was prepped and draped in sterile fashion. Using a scalpel I excised the lesion in elliptical manner down to the underlying dermis. I then closed the skin edges with 2, 3-0 Ethilon sutures.   This left a 6 mm vertical  Line on his right upper lip.   Stitches out in 7 days. The wound was then covered with Neosporin and a Band-Aid. Wound care was discussed

## 2015-10-20 ENCOUNTER — Ambulatory Visit: Payer: BLUE CROSS/BLUE SHIELD | Admitting: Family Medicine

## 2015-11-13 ENCOUNTER — Emergency Department (HOSPITAL_COMMUNITY)
Admission: EM | Admit: 2015-11-13 | Discharge: 2015-11-14 | Disposition: A | Discharge status: Home / Self Care | Payer: BLUE CROSS/BLUE SHIELD | Attending: Emergency Medicine | Admitting: Emergency Medicine

## 2015-11-13 ENCOUNTER — Encounter (HOSPITAL_COMMUNITY): Payer: Self-pay | Admitting: Emergency Medicine

## 2015-11-13 ENCOUNTER — Emergency Department (HOSPITAL_COMMUNITY): Payer: BLUE CROSS/BLUE SHIELD

## 2015-11-13 DIAGNOSIS — J209 Acute bronchitis, unspecified: Secondary | ICD-10-CM | POA: Diagnosis not present

## 2015-11-13 DIAGNOSIS — H578 Other specified disorders of eye and adnexa: Secondary | ICD-10-CM | POA: Diagnosis not present

## 2015-11-13 DIAGNOSIS — Z87828 Personal history of other (healed) physical injury and trauma: Secondary | ICD-10-CM | POA: Insufficient documentation

## 2015-11-13 DIAGNOSIS — Y9289 Other specified places as the place of occurrence of the external cause: Secondary | ICD-10-CM | POA: Diagnosis not present

## 2015-11-13 DIAGNOSIS — S20462A Insect bite (nonvenomous) of left back wall of thorax, initial encounter: Secondary | ICD-10-CM | POA: Insufficient documentation

## 2015-11-13 DIAGNOSIS — J40 Bronchitis, not specified as acute or chronic: Secondary | ICD-10-CM

## 2015-11-13 DIAGNOSIS — Y9389 Activity, other specified: Secondary | ICD-10-CM | POA: Insufficient documentation

## 2015-11-13 DIAGNOSIS — Z7951 Long term (current) use of inhaled steroids: Secondary | ICD-10-CM | POA: Diagnosis not present

## 2015-11-13 DIAGNOSIS — W57XXXA Bitten or stung by nonvenomous insect and other nonvenomous arthropods, initial encounter: Secondary | ICD-10-CM | POA: Diagnosis not present

## 2015-11-13 DIAGNOSIS — Y998 Other external cause status: Secondary | ICD-10-CM | POA: Insufficient documentation

## 2015-11-13 DIAGNOSIS — F17210 Nicotine dependence, cigarettes, uncomplicated: Secondary | ICD-10-CM | POA: Insufficient documentation

## 2015-11-13 DIAGNOSIS — Z79899 Other long term (current) drug therapy: Secondary | ICD-10-CM | POA: Insufficient documentation

## 2015-11-13 DIAGNOSIS — R05 Cough: Secondary | ICD-10-CM | POA: Diagnosis present

## 2015-11-13 LAB — CBC WITH DIFFERENTIAL/PLATELET
Basophils Absolute: 0 10*3/uL (ref 0.0–0.1)
Basophils Relative: 0 %
Eosinophils Absolute: 0.2 10*3/uL (ref 0.0–0.7)
Eosinophils Relative: 2 %
HCT: 46.5 % (ref 39.0–52.0)
Hemoglobin: 16.4 g/dL (ref 13.0–17.0)
Lymphocytes Relative: 14 %
Lymphs Abs: 0.9 10*3/uL (ref 0.7–4.0)
MCH: 32 pg (ref 26.0–34.0)
MCHC: 35.3 g/dL (ref 30.0–36.0)
MCV: 90.8 fL (ref 78.0–100.0)
Monocytes Absolute: 0.7 10*3/uL (ref 0.1–1.0)
Monocytes Relative: 10 %
Neutro Abs: 4.9 10*3/uL (ref 1.7–7.7)
Neutrophils Relative %: 74 %
Platelets: 178 10*3/uL (ref 150–400)
RBC: 5.12 MIL/uL (ref 4.22–5.81)
RDW: 12.4 % (ref 11.5–15.5)
WBC: 6.7 10*3/uL (ref 4.0–10.5)

## 2015-11-13 LAB — COMPREHENSIVE METABOLIC PANEL
ALT: 61 U/L (ref 17–63)
AST: 44 U/L — ABNORMAL HIGH (ref 15–41)
Albumin: 4.9 g/dL (ref 3.5–5.0)
Alkaline Phosphatase: 82 U/L (ref 38–126)
Anion gap: 11 (ref 5–15)
BUN: 18 mg/dL (ref 6–20)
CO2: 24 mmol/L (ref 22–32)
Calcium: 9.3 mg/dL (ref 8.9–10.3)
Chloride: 104 mmol/L (ref 101–111)
Creatinine, Ser: 1.09 mg/dL (ref 0.61–1.24)
GFR calc Af Amer: >60 mL/min (ref >60)
GFR calc non Af Amer: >60 mL/min (ref >60)
Glucose, Bld: 93 mg/dL (ref 65–99)
Potassium: 3.9 mmol/L (ref 3.5–5.1)
Sodium: 139 mmol/L (ref 135–145)
Total Bilirubin: 0.7 mg/dL (ref 0.3–1.2)
Total Protein: 7.6 g/dL (ref 6.5–8.1)

## 2015-11-13 MED ORDER — HYDROCOD POLST-CPM POLST ER 10-8 MG/5ML PO SUER
5.0000 mL | Freq: Once | ORAL | Status: AC
  Administered 2015-11-14: 5 mL via ORAL
  Filled 2015-11-13: qty 5

## 2015-11-13 NOTE — ED Provider Notes (Signed)
CSN: [id number]     Arrival date & time 11/13/15  2039 History   First MD Initiated Contact with Patient 11/13/15 2303     Chief Complaint  Patient presents with  . Generalized Body Aches  . Cough     (Consider location/radiation/quality/duration/timing/severity/associated sxs/prior Treatment) HPI   Mark Guzman is a 50 y.o. male who presents to the Emergency Department complaining of generalized body aches, sore throat at onset, cough, nasal congestion for 6 days.  He states the cough is mainly non-productive and worsens when lying down and has difficulty breathing with excessive cough.  Breathing improves with activity and standing. He states he has been taking OTC cough and cold medications and uses an albuterol inhaler without relief. He reports sick contacts at work.  He also states that he removed two deer ticks from his back one week ago.  He has been having associated chills, fever, and decreased appetite.  He denies chest pain, abd pain, vomiting, rash and headache   History reviewed. No pertinent past medical history. Past Surgical History  Procedure Laterality Date  . Rotator cuff repair Right 2009    Dr. Daylene Katayama  . Hernia repair      50 years old   Family History  Problem Relation Age of Onset  . Arthritis Father    Social History  Substance Use Topics  . Smoking status: Current Every Day Smoker -- 0.25 packs/day    Types: E-cigarettes  . Smokeless tobacco: Never Used  . Alcohol Use: 7.2 oz/week    12 Cans of beer per week     Comment: "6 pack of beer daily"    Review of Systems  Constitutional: Negative for fever, chills, activity change and appetite change.  HENT: Positive for congestion, rhinorrhea and sore throat. Negative for facial swelling and trouble swallowing.   Eyes: Negative for visual disturbance.       Both eyes are "burning"  Respiratory: Positive for cough. Negative for shortness of breath, wheezing and stridor.   Gastrointestinal: Negative for  nausea and vomiting.  Musculoskeletal: Negative for neck pain and neck stiffness.  Skin: Negative.   Neurological: Negative for dizziness, weakness, numbness and headaches.  Hematological: Negative for adenopathy.  Psychiatric/Behavioral: Negative for confusion.  All other systems reviewed and are negative.     Allergies  Review of patient's allergies indicates no known allergies.  Home Medications   Prior to Admission medications   Medication Sig Start Date End Date Taking? Authorizing Provider  albuterol (PROVENTIL HFA;VENTOLIN HFA) 108 (90 BASE) MCG/ACT inhaler Inhale 2 puffs into the lungs every 4 (four) hours as needed for wheezing or shortness of breath (or coughing). [id number]  Yes Delora Fuel, MD  Aspirin-Salicylamide-Caffeine [facility] HEADACHE) [phone] MG TABS Take 1 packet by mouth daily as needed (for pain/headache).   Yes Historical Provider, MD  DM-Doxylamine-Acetaminophen (NYQUIL COLD & FLU PO) Take 5-10 mLs by mouth daily as needed (for cough/cold).   Yes Historical Provider, MD  guaiFENesin (ROBITUSSIN) 100 MG/5ML SOLN Take 5 mLs by mouth every 4 (four) hours as needed for cough or to loosen phlegm.   Yes Historical Provider, MD  mometasone (NASONEX) 50 MCG/ACT nasal spray Place 4 sprays into the nose daily. Two sprays in each nostril [id number]  Yes Delora Fuel, MD  multivitamin-iron-minerals-folic acid (CENTRUM) chewable tablet Chew 1 tablet by mouth daily.   Yes Historical Provider, MD  OVER THE COUNTER MEDICATION Take 5-10 mLs by mouth at bedtime as needed (for sleep).   Yes  Historical Provider, MD  traZODone (DESYREL) 50 MG tablet Take 0.5-1 tablets (25-50 mg total) by mouth at bedtime as needed for sleep. 10/13/15  Yes Susy Frizzle, MD  zolpidem (AMBIEN CR) 12.5 MG CR tablet Take 1 tablet (12.5 mg total) by mouth at bedtime as needed for sleep. Patient not taking: Reported on 11/13/2015 09/25/15   Orlena Sheldon, PA-C   BP 139/102 mmHg  Pulse 92  Temp(Src) 99.8 F (37.7  C) (Oral)  Resp 18  Ht 5\' 10"  (1.778 m)  Wt 90.719 kg  BMI 28.70 kg/m2  SpO2 97% Physical Exam  Constitutional: He is oriented to person, place, and time. He appears well-developed and well-nourished. No distress.  HENT:  Head: Normocephalic and atraumatic.  Right Ear: Tympanic membrane and ear canal normal.  Left Ear: Tympanic membrane and ear canal normal.  Nose: Mucosal edema present. No rhinorrhea.  Mouth/Throat: Uvula is midline and mucous membranes are normal. No trismus in the jaw. No uvula swelling. Posterior oropharyngeal erythema present. No oropharyngeal exudate, posterior oropharyngeal edema or tonsillar abscesses.  Eyes: Conjunctivae and EOM are normal. Pupils are equal, round, and reactive to light. Right eye exhibits no discharge.  Neck: Normal range of motion and phonation normal. Neck supple. No Brudzinski's sign and no Kernig's sign noted.  Cardiovascular: Normal rate, regular rhythm, normal heart sounds and intact distal pulses.   No murmur heard. Pulmonary/Chest: Effort normal and breath sounds normal. No respiratory distress. He has no wheezes. He has no rales.  Actively coughing during exam.    Abdominal: Soft. He exhibits no distension. There is no tenderness. There is no rebound and no guarding.  Musculoskeletal: Normal range of motion. He exhibits no edema.  Lymphadenopathy:    He has no cervical adenopathy.  Neurological: He is alert and oriented to person, place, and time. He exhibits normal muscle tone. Coordination normal.  Skin: Skin is warm and dry.  Two small erythematous papules to the left back.  No surrounding erythema or edema.  No rash  Psychiatric: He has a normal mood and affect.  Nursing note and vitals reviewed.   ED Course  Procedures (including critical care time) Labs Review Labs Reviewed  COMPREHENSIVE METABOLIC PANEL - Abnormal; Notable for the following:    AST 44 (*)    All other components within normal limits  CBC WITH  DIFFERENTIAL/PLATELET    Imaging Review Dg Chest 2 View  11/13/2015  CLINICAL DATA:  Cough.  Body aches and congestion. EXAM: CHEST  2 VIEW COMPARISON:  None. FINDINGS: The heart size and mediastinal contours are within normal limits. Both lungs are clear. The visualized skeletal structures are unremarkable. IMPRESSION: No active cardiopulmonary disease. Electronically Signed   By: Van Clines M.D.   On: 11/13/2015 21:07   I have personally reviewed and evaluated these images and lab results as part of my medical decision-making.    MDM   Final diagnoses:  Bronchitis  Tick bite    Pt is non-toxic appearing.  No tachycardia, hypoxia, or tachypnea.  Sx's are likely viral, but has a reported tick bite with associated body aches and joint pains.  Discussed care pplan with Dr. Jeneen Rinks.  Will rx tussionex, doxycycline and prednisone.  Pt has albuterol inhaler at home.  Advised to close PMD f/u or return here for worsening sx's.  Pt agrees, appears stable for d/c    Kem Parkinson, PA-C 11/14/15 0035  Tanna Furry, MD 11/18/15 1220

## 2015-11-13 NOTE — ED Notes (Signed)
Patient complaining of body aches, cough, and congestion x 6 days.

## 2015-11-14 MED ORDER — DOXYCYCLINE HYCLATE 100 MG PO CAPS: 100.0000 mg | ORAL_CAPSULE | Freq: Two times a day (BID) | ORAL | Status: DC

## 2015-11-14 MED ORDER — HYDROCOD POLST-CPM POLST ER 10-8 MG/5ML PO SUER: 5.0000 mL | Freq: Two times a day (BID) | ORAL | Status: DC | PRN

## 2015-11-14 MED ORDER — PREDNISONE 20 MG PO TABS: 40.0000 mg | ORAL_TABLET | Freq: Every day | ORAL | Status: DC

## 2015-11-14 MED ORDER — DOXYCYCLINE HYCLATE 100 MG PO TABS
100.0000 mg | ORAL_TABLET | Freq: Once | ORAL | Status: AC
  Administered 2015-11-14: 100 mg via ORAL
  Filled 2015-11-14: qty 1

## 2015-11-14 NOTE — Discharge Instructions (Signed)
Upper Respiratory Infection, Adult Most upper respiratory infections (URIs) are caused by a virus. A URI affects the nose, throat, and upper air passages. The most common type of URI is often called "the common cold." HOME CARE   Take medicines only as told by your doctor.  Gargle warm saltwater or take cough drops to comfort your throat as told by your doctor.  Use a warm mist humidifier or inhale steam from a shower to increase air moisture. This may make it easier to breathe.  Drink enough fluid to keep your pee (urine) clear or pale yellow.  Eat soups and other clear broths.  Have a healthy diet.  Rest as needed.  Go back to work when your fever is gone or your doctor says it is okay.  You may need to stay home longer to avoid giving your URI to others.  You can also wear a face mask and wash your hands often to prevent spread of the virus.  Use your inhaler more if you have asthma.  Do not use any tobacco products, including cigarettes, chewing tobacco, or electronic cigarettes. If you need help quitting, ask your doctor. GET HELP IF: 1. You are getting worse, not better. 2. Your symptoms are not helped by medicine. 3. You have chills. 4. You are getting more short of breath. 5. You have brown or red mucus. 6. You have yellow or brown discharge from your nose. 7. You have pain in your face, especially when you bend forward. 8. You have a fever. 9. You have puffy (swollen) neck glands. 10. You have pain while swallowing. 11. You have white areas in the back of your throat. GET HELP RIGHT AWAY IF:   You have very bad or constant:  Headache.  Ear pain.  Pain in your forehead, behind your eyes, and over your cheekbones (sinus pain).  Chest pain.  You have long-lasting (chronic) lung disease and any of the following:  Wheezing.  Long-lasting cough.  Coughing up blood.  A change in your usual mucus.  You have a stiff neck.  You have changes in  your:  Vision.  Hearing.  Thinking.  Mood. MAKE SURE YOU:   Understand these instructions.  Will watch your condition.  Will get help right away if you are not doing well or get worse.   This information is not intended to replace advice given to you by your health care provider. Make sure you discuss any questions you have with your health care provider.   Document Released: 04/15/2008 Document Revised: 03/14/2015 Document Reviewed: 02/02/2014 Elsevier Interactive Patient Education 2016 [employer] Information Ticks are insects that attach themselves to the skin. There are many types of ticks. Common types include wood ticks and deer ticks. Sometimes, ticks carry diseases that can make a person very ill. The most common places for ticks to attach themselves are the scalp, neck, armpits, waist, and groin.  HOW CAN YOU PREVENT TICK BITES? Take these steps to help prevent tick bites when you are outdoors:  Wear long sleeves and long pants.  Wear white clothes so you can see ticks more easily.  Tuck your pant legs into your socks.  If walking on a trail, stay in the middle of the trail to avoid brushing against bushes.  Avoid walking through areas with long grass.  Put bug spray on all skin that is showing and along boot tops, pant legs, and sleeve cuffs.  Check clothes, hair, and skin often  and before going inside.  Brush off any ticks that are not attached.  Take a shower or bath as soon as possible after being outdoors. HOW SHOULD YOU REMOVE A TICK? Ticks should be removed as soon as possible to help prevent diseases. 12. If latex gloves are available, put them on before trying to remove a tick. 13. Use tweezers to grasp the tick as close to the skin as possible. You may also use curved forceps or a tick removal tool. Grasp the tick as close to its head as possible. Avoid grasping the tick on its body. 14. Pull gently upward until the tick lets go. Do not  twist the tick or jerk it suddenly. This may break off the tick's head or mouth parts. 15. Do not squeeze or crush the tick's body. This could force disease-carrying fluids from the tick into your body. 16. After the tick is removed, wash the bite area and your hands with soap and water or alcohol. 17. Apply a small amount of antiseptic cream or ointment to the bite site. 18. Wash any tools that were used. Do not try to remove a tick by applying a hot match, petroleum jelly, or fingernail polish to the tick. These methods do not work. They may also increase the chances of disease being spread from the tick bite. WHEN SHOULD YOU SEEK HELP? Contact your health care provider if you are unable to remove a tick or if a part of the tick breaks off in the skin. After a tick bite, you need to watch for signs and symptoms of diseases that can be spread by ticks. Contact your health care provider if you develop any of the following:  Fever.  Rash.  Redness and puffiness (swelling) in the area of the tick bite.  Tender, puffy lymph glands.  Watery poop (diarrhea).  Weight loss.  Cough.  Feeling more tired than normal (fatigue).  Muscle, joint, or bone pain.  Belly (abdominal) pain.  Headache.  Change in your level of consciousness.  Trouble walking or moving your legs.  Loss of feeling (numbness) in the legs.  Loss of movement (paralysis).  Shortness of breath.  Confusion.  Throwing up (vomiting) many times.   This information is not intended to replace advice given to you by your health care provider. Make sure you discuss any questions you have with your health care provider.   Document Released: 01/22/2010 Document Revised: 06/30/2013 Document Reviewed: 04/07/2013 Elsevier Interactive Patient Education [employer].

## 2016-03-29 ENCOUNTER — Encounter: Payer: Self-pay | Admitting: Family Medicine

## 2016-03-29 ENCOUNTER — Ambulatory Visit (INDEPENDENT_AMBULATORY_CARE_PROVIDER_SITE_OTHER): Payer: BLUE CROSS/BLUE SHIELD | Admitting: Family Medicine

## 2016-03-29 VITALS — BP 144/104 | HR 104 | Temp 98.6°F | Resp 18 | Wt 202.0 lb

## 2016-03-29 DIAGNOSIS — I10 Essential (primary) hypertension: Secondary | ICD-10-CM

## 2016-03-29 MED ORDER — LOSARTAN POTASSIUM-HCTZ 100-25 MG PO TABS: 1.0000 | ORAL_TABLET | Freq: Every day | ORAL | Status: DC

## 2016-03-29 NOTE — Progress Notes (Signed)
   Subjective:    Patient ID: Mark Guzman, male    DOB: [birth year], 50 y.o.   MRN: [id number]  HPI  Patient has been checking his blood pressure at home frequently as of late. His systolic blood pressure is between 135 and 145. His diastolic blood pressure is consistently between 90 and 110. He is also reporting a dull headache. He does eat a lot of sodium. He denies any chest pain shortness of breath or dyspnea on exertion No past medical history on file. Past Surgical History  Procedure Laterality Date  . Rotator cuff repair Right 2009    Dr. Daylene Katayama  . Hernia repair      50 years old   Current Outpatient Prescriptions on File Prior to Visit  Medication Sig Dispense Refill  . albuterol (PROVENTIL HFA;VENTOLIN HFA) 108 (90 BASE) MCG/ACT inhaler Inhale 2 puffs into the lungs every 4 (four) hours as needed for wheezing or shortness of breath (or coughing). 1 Inhaler 0  . Aspirin-Salicylamide-Caffeine (BC HEADACHE) 325-95-16 MG TABS Take 1 packet by mouth daily as needed (for pain/headache).    . mometasone (NASONEX) 50 MCG/ACT nasal spray Place 4 sprays into the nose daily. Two sprays in each nostril 17 g 12  . multivitamin-iron-minerals-folic acid (CENTRUM) chewable tablet Chew 1 tablet by mouth daily.    Marland Kitchen OVER THE COUNTER MEDICATION Take 5-10 mLs by mouth at bedtime as needed (for sleep  ZQUIL).      No current facility-administered medications on file prior to visit.   No Known Allergies Social History   Social History  . Marital Status: Married    Spouse Name: N/A  . Number of Children: N/A  . Years of Education: N/A   Occupational History  . Not on file.   Social History Main Topics  . Smoking status: Current Every Day Smoker -- 0.25 packs/day    Types: E-cigarettes  . Smokeless tobacco: Never Used  . Alcohol Use: 7.2 oz/week    12 Cans of beer per week     Comment: "6 pack of beer daily"  . Drug Use: No  . Sexual Activity: Yes   Other Topics Concern  . Not on  file   Social History Narrative     Review of Systems  All other systems reviewed and are negative.      Objective:   Physical Exam  Constitutional: He appears well-nourished.  Neck: Neck supple.  Cardiovascular: Normal rate, regular rhythm and normal heart sounds.   Pulmonary/Chest: Effort normal and breath sounds normal. No respiratory distress. He has no wheezes. He has no rales.  Musculoskeletal: He exhibits no edema.  Lymphadenopathy:    He has no cervical adenopathy.  Vitals reviewed.         Assessment & Plan:  Benign essential HTN - Plan: losartan-hydrochlorothiazide (HYZAAR) 100-25 MG tablet  Begin Hyzaar 100/25 mg pills. However I want him to take one half a pill a day even though the prescription says take 1 pill a day. We will recheck his blood pressure in 2 weeks. Would consider increasing the medication at that time if he has not achieved his goal blood pressure. I also recommended decreasing his sodium consumption and increasing aerobic exercise.

## 2016-04-12 ENCOUNTER — Telehealth: Payer: Self-pay | Admitting: Family Medicine

## 2016-04-12 NOTE — Telephone Encounter (Signed)
Those are excellent.  Continue current dose of hyzaar.

## 2016-04-12 NOTE — Telephone Encounter (Signed)
A nurse called and left a VM with pt's blood pressure readings. They are as follows: 116/86, 105/74, 117/82, 122/82, 119/89, 119/87, 121/78 No other information was left.

## 2016-04-12 NOTE — Telephone Encounter (Signed)
MD please advise

## 2016-04-12 NOTE — Telephone Encounter (Signed)
Call placed to patient and patient made aware per VM.  

## 2016-07-31 ENCOUNTER — Other Ambulatory Visit: Payer: Self-pay | Admitting: Family Medicine

## 2017-01-19 ENCOUNTER — Other Ambulatory Visit: Payer: Self-pay | Admitting: Family Medicine

## 2017-01-20 NOTE — Telephone Encounter (Signed)
Medication refilled per protocol. 

## 2017-05-29 ENCOUNTER — Other Ambulatory Visit: Payer: Self-pay | Admitting: Family Medicine

## 2017-05-29 DIAGNOSIS — I10 Essential (primary) hypertension: Secondary | ICD-10-CM

## 2017-10-05 ENCOUNTER — Other Ambulatory Visit: Payer: Self-pay | Admitting: Family Medicine

## 2017-10-06 NOTE — Telephone Encounter (Signed)
Pt has not been seen in office since 03/2016.  Refill denied

## 2017-12-14 ENCOUNTER — Encounter (HOSPITAL_COMMUNITY): Payer: Self-pay | Admitting: Emergency Medicine

## 2017-12-14 ENCOUNTER — Other Ambulatory Visit: Payer: Self-pay

## 2017-12-14 ENCOUNTER — Emergency Department (HOSPITAL_COMMUNITY): Payer: BLUE CROSS/BLUE SHIELD

## 2017-12-14 ENCOUNTER — Emergency Department (HOSPITAL_COMMUNITY)
Admission: EM | Admit: 2017-12-14 | Discharge: 2017-12-14 | Disposition: A | Discharge status: Home / Self Care | Payer: BLUE CROSS/BLUE SHIELD | Attending: Emergency Medicine | Admitting: Emergency Medicine

## 2017-12-14 DIAGNOSIS — S8262XA Displaced fracture of lateral malleolus of left fibula, initial encounter for closed fracture: Secondary | ICD-10-CM | POA: Diagnosis not present

## 2017-12-14 DIAGNOSIS — Y9301 Activity, walking, marching and hiking: Secondary | ICD-10-CM | POA: Diagnosis not present

## 2017-12-14 DIAGNOSIS — Y92009 Unspecified place in unspecified non-institutional (private) residence as the place of occurrence of the external cause: Secondary | ICD-10-CM | POA: Insufficient documentation

## 2017-12-14 DIAGNOSIS — F17290 Nicotine dependence, other tobacco product, uncomplicated: Secondary | ICD-10-CM | POA: Diagnosis not present

## 2017-12-14 DIAGNOSIS — Y999 Unspecified external cause status: Secondary | ICD-10-CM | POA: Diagnosis not present

## 2017-12-14 DIAGNOSIS — S82832A Other fracture of upper and lower end of left fibula, initial encounter for closed fracture: Secondary | ICD-10-CM | POA: Insufficient documentation

## 2017-12-14 DIAGNOSIS — Z79899 Other long term (current) drug therapy: Secondary | ICD-10-CM | POA: Diagnosis not present

## 2017-12-14 DIAGNOSIS — W1840XA Slipping, tripping and stumbling without falling, unspecified, initial encounter: Secondary | ICD-10-CM | POA: Insufficient documentation

## 2017-12-14 DIAGNOSIS — I10 Essential (primary) hypertension: Secondary | ICD-10-CM | POA: Diagnosis not present

## 2017-12-14 DIAGNOSIS — S99912A Unspecified injury of left ankle, initial encounter: Secondary | ICD-10-CM | POA: Diagnosis present

## 2017-12-14 HISTORY — DX: Essential (primary) hypertension: I10

## 2017-12-14 MED ORDER — OXYCODONE-ACETAMINOPHEN 5-325 MG PO TABS: 1.0000 | ORAL_TABLET | ORAL | 0 refills | Status: DC | PRN

## 2017-12-14 MED ORDER — HYDROCODONE-ACETAMINOPHEN 5-325 MG PO TABS
2.0000 | ORAL_TABLET | Freq: Once | ORAL | Status: AC
  Administered 2017-12-14: 2 via ORAL
  Filled 2017-12-14: qty 2

## 2017-12-14 MED ORDER — HYDROCODONE-ACETAMINOPHEN 5-325 MG PO TABS: 2.0000 | ORAL_TABLET | ORAL | 0 refills | Status: DC | PRN

## 2017-12-14 NOTE — Discharge Instructions (Signed)
You have a broken ankle, please do not bear any weight, use the crutches to walk, keep your splint in place, Dr. Percell Inaaya Vellucci will see you tomorrow morning at 830 Avoid ibuprofen, Tylenol or hydrocodone for severe pain, keep the foot elevated

## 2017-12-14 NOTE — ED Notes (Signed)
ED Provider at bedside. 

## 2017-12-14 NOTE — ED Provider Notes (Signed)
[facility] EMERGENCY DEPARTMENT Provider Note   CSN: [id number] Arrival date & time: 12/14/17  3299     History   Chief Complaint Chief Complaint  Patient presents with  . Ankle Injury    HPI Mark Guzman is a 52 y.o. male.  HPI  The patient is a 52 year old male, he presents after injuring his left ankle last night stating that he was carrying firewood down the stairs to his basement when his left ankle rolled and inverted causing acute onset of pain and swelling which has been present through the night, worse with ambulation and not associated with any open breaks in the skin or numbness of the foot.  He has taken some aspirin-containing over-the-counter medication prehospital without relief  Past Medical History:  Diagnosis Date  . Hypertension     Patient Active Problem List   Diagnosis Date Noted  . Insomnia 09/25/2015    Past Surgical History:  Procedure Laterality Date  . HERNIA REPAIR     52 years old  . nose repair    . ROTATOR CUFF REPAIR Right 2009   Dr. Daylene Katayama       Home Medications    Prior to Admission medications   Medication Sig Start Date End Date Taking? Authorizing Provider  Aspirin-Salicylamide-Caffeine (BC HEADACHE) 325-95-16 MG TABS Take 1 packet by mouth daily as needed (for pain/headache).    [provider]  HYDROcodone-acetaminophen (NORCO/VICODIN) 5-325 MG tablet Take 2 tablets by mouth every 4 (four) hours as needed. 12/14/17   Noemi Chapel, MD  losartan-hydrochlorothiazide [facility]) 100-25 MG tablet TAKE 1 TABLET BY MOUTH EVERY DAY 05/29/17   Susy Frizzle, MD  mometasone (NASONEX) 50 MCG/ACT nasal spray PLACE 2 SPRAYS IN [facility] NOSTRIL DAILY 08/01/16   Susy Frizzle, MD  multivitamin-iron-minerals-folic acid (CENTRUM) chewable tablet Chew 1 tablet by mouth daily.    [provider]  OVER THE COUNTER MEDICATION Take 5-10 mLs by mouth at bedtime as needed (for sleep  ZQUIL).     [provider]  VENTOLIN  HFA 108 (90 Base) MCG/ACT inhaler INHALE 2 PUFFS INTO THE LUNGS EVERY 4 HOURS AS NEEDED FOR WHEEZING OR SHORTNESS OF BREATH/COUGHING 01/20/17   Susy Frizzle, MD    Family History Family History  Problem Relation Age of Onset  . Arthritis Father     Social History Social History   Tobacco Use  . Smoking status: Current Every Day Smoker    Packs/day: 0.25    Types: E-cigarettes  . Smokeless tobacco: Never Used  Substance Use Topics  . Alcohol use: Yes    Alcohol/week: 7.2 oz    Types: 12 Cans of beer per week    Comment: "6 pack of beer daily"  . Drug use: No     Allergies   Patient has no known allergies.   Review of Systems Review of Systems  Musculoskeletal: Positive for arthralgias and joint swelling.  Skin: Negative for wound.     Physical Exam Updated Vital Signs BP 132/82 (BP Location: Right Arm)   Pulse (!) 104   Temp 98.2 F (36.8 C) (Oral)   Resp 18   Ht 5\' 11"  (1.803 m)   Wt 90.7 kg (200 lb)   SpO2 100%   BMI 27.89 kg/m   Physical Exam  Constitutional: He appears well-developed and well-nourished. No distress.  HENT:  Head: Normocephalic and atraumatic.  Eyes: Conjunctivae are normal. No scleral icterus.  Cardiovascular: Normal rate, regular rhythm and intact distal pulses.  Pulses at the foot normal  Pulmonary/Chest: Effort normal and breath sounds normal.  Musculoskeletal: He exhibits tenderness. He exhibits no edema.  Left ankle is swollen laterally, no tenderness over the medial malleolus, no tenderness over the foot bones, normal range of motion of the knee without tenderness over the proximal fibular head  Neurological: He is alert.  Skin: Skin is warm and dry. No rash noted. He is not diaphoretic.  Nursing note and vitals reviewed.    ED Treatments / Results  Labs (all labs ordered are listed, but only abnormal results are displayed) Labs Reviewed - No data to display   Radiology Dg Ankle Complete Left  Result Date:  12/14/2017 CLINICAL DATA:  Status post inversion injury to the left ankle. EXAM: LEFT ANKLE COMPLETE - 3+ VIEW COMPARISON:  None. FINDINGS: There is a transverse minimally comminuted fracture of the distal left fibula with minimal posterolateral displacement of the distal fracture fragment and extension to the tibio-fibular syndesmosis. Ankle mortise appears preserved. Associated lateral ankle soft tissue swelling. IMPRESSION: Transverse minimally comminuted and displaced distal fibular fracture. Electronically Signed   By: Fidela Salisbury M.D.   On: 12/14/2017 09:47    Procedures Procedures (including critical care time)  Medications Ordered in ED Medications  HYDROcodone-acetaminophen (NORCO/VICODIN) 5-325 MG per tablet 2 tablet (2 tablets Oral Given 12/14/17 7903)     Initial Impression / Assessment and Plan / ED Course  I have reviewed the triage vital signs and the nursing notes.  Pertinent labs & imaging results that were available during my care of the patient were reviewed by me and considered in my medical decision making (see chart for details).  Clinical Course as of Dec 14 1009  Sun Dec 14, 2017  0949 I have personally seen and interpreted the xrays of the L ankle - there is an acute distal fibular fracture of the ankle.  I have discussed the findings with the pt - he requests f/u with Dr. Charlott Holler  [BM]    Clinical Course User Index [BM] Noemi Chapel, MD    X-rays ordered to evaluate for fracture, ice, rest, immobilization and elevation.  Paged Dr. Percell Kolbie [place] - will see in the morning at 8:30   Splint ordered - placed by nursing  Reexamined after splint with good NV function / status Crutches Pain meds and f/u.  Final Clinical Impressions(s) / ED Diagnoses   Final diagnoses:  Closed fracture of distal end of left fibula, unspecified fracture morphology, initial encounter    ED Discharge Orders        Ordered    HYDROcodone-acetaminophen (NORCO/VICODIN) 5-325 MG  tablet  Every 4 hours PRN     12/14/17 1009       Noemi Chapel, MD 12/14/17 1011

## 2017-12-14 NOTE — ED Triage Notes (Signed)
PT states he was carrying fire wood down his basement stairs and near the bottom step rolled his left ankle around midnight last night. Swelling and pain noted to left ankle.

## 2017-12-15 DIAGNOSIS — M25572 Pain in left ankle and joints of left foot: Secondary | ICD-10-CM | POA: Diagnosis not present

## 2017-12-18 DIAGNOSIS — G8918 Other acute postprocedural pain: Secondary | ICD-10-CM | POA: Diagnosis not present

## 2017-12-18 DIAGNOSIS — S82842A Displaced bimalleolar fracture of left lower leg, initial encounter for closed fracture: Secondary | ICD-10-CM | POA: Diagnosis not present

## 2017-12-23 DIAGNOSIS — S82842D Displaced bimalleolar fracture of left lower leg, subsequent encounter for closed fracture with routine healing: Secondary | ICD-10-CM | POA: Diagnosis not present

## 2017-12-29 DIAGNOSIS — S82842D Displaced bimalleolar fracture of left lower leg, subsequent encounter for closed fracture with routine healing: Secondary | ICD-10-CM | POA: Diagnosis not present

## 2018-01-26 ENCOUNTER — Ambulatory Visit: Payer: BLUE CROSS/BLUE SHIELD | Admitting: Family Medicine

## 2018-01-26 ENCOUNTER — Encounter: Payer: Self-pay | Admitting: Family Medicine

## 2018-01-26 ENCOUNTER — Other Ambulatory Visit: Payer: Self-pay | Admitting: Family Medicine

## 2018-01-26 VITALS — BP 130/84 | HR 94 | Temp 98.7°F | Resp 14 | Ht 70.0 in | Wt 200.0 lb

## 2018-01-26 DIAGNOSIS — Z1211 Encounter for screening for malignant neoplasm of colon: Secondary | ICD-10-CM

## 2018-01-26 DIAGNOSIS — Z125 Encounter for screening for malignant neoplasm of prostate: Secondary | ICD-10-CM | POA: Diagnosis not present

## 2018-01-26 DIAGNOSIS — F5101 Primary insomnia: Secondary | ICD-10-CM | POA: Diagnosis not present

## 2018-01-26 DIAGNOSIS — I10 Essential (primary) hypertension: Secondary | ICD-10-CM

## 2018-01-26 MED ORDER — ALPRAZOLAM 0.5 MG PO TABS: 0.5000 mg | ORAL_TABLET | Freq: Every evening | ORAL | 1 refills | Status: DC | PRN

## 2018-01-26 NOTE — Progress Notes (Signed)
Subjective:    Patient ID: Mark Guzman, male    DOB: [birth year], 52 y.o.   MRN: [id number]  HPI  Patient is a 52 year old Caucasian male here today for refill on his blood pressure medication.  He is currently on Hyzaar 100/25 1 p.o. daily.  He takes a half a tablet and his blood pressure is well controlled.  He denies any side effects from the medication.  He denies any chest pain shortness of breath dyspnea on exertion.  He is overdue for fasting lab work.  He is also due for colon cancer screening along with prostate cancer screening.  Recently he suffered a left fibular fracture currently being managed by orthopedics.  He had open reduction internal fixation and is currently in a Cam walker.  He would like for me to schedule a colonoscopy in the distant future so he can recover from this first.  He is also suffering from insomnia.  In the past he is tried Ambien and that has not worked for him.  He is currently taking Z quill but does not like the way the medication makes him feel in the morning.  He would like to try Xanax which his wife uses sparingly for insomnia. Past Medical History:  Diagnosis Date  . Hypertension    Past Surgical History:  Procedure Laterality Date  . HERNIA REPAIR     52 years old  . nose repair    . ROTATOR CUFF REPAIR Right 2009   Dr. Daylene Guzman   Current Outpatient Medications on File Prior to Visit  Medication Sig Dispense Refill  . losartan-hydrochlorothiazide (HYZAAR) 100-25 MG tablet TAKE 1 TABLET BY MOUTH EVERY DAY 90 tablet 2  . multivitamin-iron-minerals-folic acid (CENTRUM) chewable tablet Chew 1 tablet by mouth daily.    . VENTOLIN HFA 108 (90 Base) MCG/ACT inhaler INHALE 2 PUFFS INTO THE LUNGS EVERY 4 HOURS AS NEEDED FOR WHEEZING OR SHORTNESS OF BREATH/COUGHING 18 g 1  . mometasone (NASONEX) 50 MCG/ACT nasal spray PLACE 2 SPRAYS IN EACH NOSTRIL DAILY (Patient not taking: Reported on 01/26/2018) 17 g 8   No current facility-administered medications on  file prior to visit.    No Known Allergies Social History   Socioeconomic History  . Marital status: Married    Spouse name: Not on file  . Number of children: Not on file  . Years of education: Not on file  . Highest education level: Not on file  Social Needs  . Financial resource strain: Not on file  . Food insecurity - worry: Not on file  . Food insecurity - inability: Not on file  . Transportation needs - medical: Not on file  . Transportation needs - non-medical: Not on file  Occupational History  . Not on file  Tobacco Use  . Smoking status: Current Every Day Smoker    Packs/day: 0.25    Types: E-cigarettes  . Smokeless tobacco: Never Used  Substance and Sexual Activity  . Alcohol use: Yes    Alcohol/week: 7.2 oz    Types: 12 Cans of beer per week    Comment: "6 pack of beer daily"  . Drug use: No  . Sexual activity: Yes  Other Topics Concern  . Not on file  Social History Narrative  . Not on file     Review of Systems  All other systems reviewed and are negative.      Objective:   Physical Exam  Constitutional: He appears well-developed and well-nourished.  Neck: Neck  supple. No JVD present.  Cardiovascular: Normal rate, regular rhythm and normal heart sounds.  Pulmonary/Chest: Effort normal and breath sounds normal. No respiratory distress. He has no wheezes. He has no rales.  Abdominal: Soft. Bowel sounds are normal.  Lymphadenopathy:    He has no cervical adenopathy.  Vitals reviewed.         Assessment & Plan:  Benign essential HTN - Plan: COMPLETE METABOLIC PANEL WITH GFR, Lipid panel, CBC with Differential/Platelet  Special screening, prostate cancer - Plan: PSA  Colon cancer screening - Plan: Ambulatory referral to Gastroenterology  Primary insomnia - Plan: ALPRAZolam (XANAX) 0.5 MG tablet  Patient's blood pressure is well controlled.  Check CBC, CMP, fasting lipid panel.  Goal LDL cholesterol is less than 130.  While checking lab work  I will also screen for prostate cancer with PSA.  I will make a referral to gastroenterology for a colonoscopy.  I agreed to give the patient Xanax 0.5 mg tablets 1 p.o. nightly as needed for insomnia.  I have recommended that he use a half to whole tablet sparingly.  We discussed the risk of habituation and dependency.  The patient is aware of how the medication works and will use it sparingly to help him sleep.

## 2018-01-27 LAB — COMPLETE METABOLIC PANEL WITH GFR
AG Ratio: 2.3 (calc) (ref 1.0–2.5)
ALT: 30 U/L (ref 9–46)
AST: 18 U/L (ref 10–35)
Albumin: 4.5 g/dL (ref 3.6–5.1)
Alkaline phosphatase (APISO): 69 U/L (ref 40–115)
BUN: 23 mg/dL (ref 7–25)
CO2: 29 mmol/L (ref 20–32)
Calcium: 10 mg/dL (ref 8.6–10.3)
Chloride: 103 mmol/L (ref 98–110)
Creat: 1.26 mg/dL (ref 0.70–1.33)
GFR, Est African American: 76 mL/min/{1.73_m2} (ref >=60)
GFR, Est Non African American: 66 mL/min/{1.73_m2} (ref >=60)
Globulin: 2 g/dL (calc) (ref 1.9–3.7)
Glucose, Bld: 90 mg/dL (ref 65–99)
Potassium: 4.6 mmol/L (ref 3.5–5.3)
Sodium: 140 mmol/L (ref 135–146)
Total Bilirubin: 0.5 mg/dL (ref 0.2–1.2)
Total Protein: 6.5 g/dL (ref 6.1–8.1)

## 2018-01-27 LAB — CBC WITH DIFFERENTIAL/PLATELET
Basophils Absolute: 58 cells/uL (ref 0–200)
Basophils Relative: 0.6 %
Eosinophils Absolute: 407 cells/uL (ref 15–500)
Eosinophils Relative: 4.2 %
HCT: 43.8 % (ref 38.5–50.0)
Hemoglobin: 15.7 g/dL (ref 13.2–17.1)
Lymphs Abs: 2425 cells/uL (ref 850–3900)
MCH: 32.4 pg (ref 27.0–33.0)
MCHC: 35.8 g/dL (ref 32.0–36.0)
MCV: 90.3 fL (ref 80.0–100.0)
MPV: 9.4 fL (ref 7.5–12.5)
Monocytes Relative: 8.6 %
Neutro Abs: 5975 cells/uL (ref 1500–7800)
Neutrophils Relative %: 61.6 %
Platelets: 275 10*3/uL (ref 140–400)
RBC: 4.85 10*6/uL (ref 4.20–5.80)
RDW: 12.4 % (ref 11.0–15.0)
Total Lymphocyte: 25 %
WBC mixed population: 834 cells/uL (ref 200–950)
WBC: 9.7 10*3/uL (ref 3.8–10.8)

## 2018-01-27 LAB — LIPID PANEL
Cholesterol: 203 mg/dL — ABNORMAL HIGH (ref <200)
HDL: 46 mg/dL (ref >40)
LDL Cholesterol (Calc): 116 mg/dL (calc) — ABNORMAL HIGH
Non-HDL Cholesterol (Calc): 157 mg/dL (calc) — ABNORMAL HIGH (ref <130)
Total CHOL/HDL Ratio: 4.4 (calc) (ref <5.0)
Triglycerides: 311 mg/dL — ABNORMAL HIGH (ref <150)

## 2018-01-27 LAB — PSA: PSA: 0.9 ng/mL (ref <=4.0)

## 2018-01-28 DIAGNOSIS — S82842D Displaced bimalleolar fracture of left lower leg, subsequent encounter for closed fracture with routine healing: Secondary | ICD-10-CM | POA: Diagnosis not present

## 2018-02-09 ENCOUNTER — Other Ambulatory Visit: Payer: Self-pay | Admitting: Family Medicine

## 2018-02-09 MED ORDER — LORATADINE 10 MG PO TABS: 10.0000 mg | ORAL_TABLET | Freq: Every day | ORAL | 3 refills | Status: DC

## 2018-02-16 ENCOUNTER — Other Ambulatory Visit: Payer: Self-pay | Admitting: Family Medicine

## 2018-02-16 DIAGNOSIS — I10 Essential (primary) hypertension: Secondary | ICD-10-CM

## 2018-03-02 DIAGNOSIS — S82842D Displaced bimalleolar fracture of left lower leg, subsequent encounter for closed fracture with routine healing: Secondary | ICD-10-CM | POA: Diagnosis not present

## 2018-03-31 ENCOUNTER — Other Ambulatory Visit: Payer: Self-pay | Admitting: Family Medicine

## 2018-03-31 DIAGNOSIS — F5101 Primary insomnia: Secondary | ICD-10-CM

## 2018-03-31 NOTE — Telephone Encounter (Signed)
Ok to refill??  Last office visit/ refill 01/26/2018.

## 2018-04-03 ENCOUNTER — Encounter: Payer: Self-pay | Admitting: Family Medicine

## 2018-06-01 ENCOUNTER — Other Ambulatory Visit: Payer: Self-pay | Admitting: Family Medicine

## 2018-06-01 DIAGNOSIS — F5101 Primary insomnia: Secondary | ICD-10-CM

## 2018-06-01 NOTE — Telephone Encounter (Signed)
Pt is requesting refill on Xanax   LOV: 01/26/18  LRF:    03/31/18

## 2018-07-31 ENCOUNTER — Other Ambulatory Visit: Payer: Self-pay | Admitting: Family Medicine

## 2018-07-31 DIAGNOSIS — F5101 Primary insomnia: Secondary | ICD-10-CM

## 2018-07-31 NOTE — Telephone Encounter (Signed)
Ok to refill??  Last office visit 01/26/2018.  Last refill 06/01/2018, #1 refills.

## 2018-08-07 ENCOUNTER — Other Ambulatory Visit: Payer: Self-pay | Admitting: Family Medicine

## 2018-08-31 ENCOUNTER — Other Ambulatory Visit: Payer: Self-pay | Admitting: Family Medicine

## 2018-10-04 ENCOUNTER — Other Ambulatory Visit: Payer: Self-pay | Admitting: Family Medicine

## 2018-10-04 DIAGNOSIS — F5101 Primary insomnia: Secondary | ICD-10-CM

## 2018-10-05 NOTE — Telephone Encounter (Signed)
Pt is requesting refill on Xanax   LOV: 01/26/18  LRF:   07/31/18

## 2018-10-07 ENCOUNTER — Other Ambulatory Visit: Payer: Self-pay | Admitting: *Deleted

## 2018-10-07 DIAGNOSIS — F5101 Primary insomnia: Secondary | ICD-10-CM

## 2018-10-07 MED ORDER — ALPRAZOLAM 1 MG PO TABS: ORAL_TABLET | ORAL | 1 refills | Status: DC

## 2019-01-10 ENCOUNTER — Other Ambulatory Visit: Payer: Self-pay | Admitting: Family Medicine

## 2019-01-10 DIAGNOSIS — F5101 Primary insomnia: Secondary | ICD-10-CM

## 2019-01-11 NOTE — Telephone Encounter (Signed)
Ok to refill??  Last office visit 01/26/2018.  Last refill 10/07/2018, #1 refill.

## 2019-03-15 ENCOUNTER — Other Ambulatory Visit: Payer: Self-pay | Admitting: Family Medicine

## 2019-03-15 DIAGNOSIS — F5101 Primary insomnia: Secondary | ICD-10-CM

## 2019-03-15 NOTE — Telephone Encounter (Signed)
Pt is requesting refill on Xanax   LOV:  01/26/18  LRF:   01/11/19

## 2019-03-17 ENCOUNTER — Other Ambulatory Visit: Payer: Self-pay | Admitting: Family Medicine

## 2019-05-02 ENCOUNTER — Other Ambulatory Visit: Payer: Self-pay | Admitting: Family Medicine

## 2019-05-07 ENCOUNTER — Other Ambulatory Visit: Payer: Self-pay | Admitting: Family Medicine

## 2019-05-07 ENCOUNTER — Other Ambulatory Visit: Payer: Self-pay

## 2019-05-07 ENCOUNTER — Ambulatory Visit (INDEPENDENT_AMBULATORY_CARE_PROVIDER_SITE_OTHER): Payer: 59 | Admitting: Family Medicine

## 2019-05-07 VITALS — BP 132/88 | HR 91 | Temp 98.2°F | Resp 18 | Ht 71.0 in | Wt 203.0 lb

## 2019-05-07 DIAGNOSIS — F5101 Primary insomnia: Secondary | ICD-10-CM

## 2019-05-07 DIAGNOSIS — I10 Essential (primary) hypertension: Secondary | ICD-10-CM

## 2019-05-07 DIAGNOSIS — D489 Neoplasm of uncertain behavior, unspecified: Secondary | ICD-10-CM

## 2019-05-07 DIAGNOSIS — L989 Disorder of the skin and subcutaneous tissue, unspecified: Secondary | ICD-10-CM

## 2019-05-07 MED ORDER — ALPRAZOLAM 0.5 MG PO TABS: ORAL_TABLET | ORAL | 1 refills | Status: DC

## 2019-05-07 MED ORDER — LOSARTAN POTASSIUM-HCTZ 100-25 MG PO TABS: 1.0000 | ORAL_TABLET | Freq: Every day | ORAL | 2 refills | Status: DC

## 2019-05-07 NOTE — Telephone Encounter (Signed)
Refill on xanax and losartan to [facility]

## 2019-05-07 NOTE — Telephone Encounter (Signed)
Pt is requesting refill on Xanax   LOV: 05/07/19  LRF:   03/15/19

## 2019-05-07 NOTE — Progress Notes (Signed)
Subjective:    Patient ID: Mark Guzman, male    DOB: 30-Oct-1966, 53 y.o.   MRN: [id number]  HPI Patient is a very pleasant 53 year old Caucasian gentleman who presents today with hypertension.  He is taking one half of Hyzaar every day for high blood pressure.  Recently his systolic blood pressure has consistently been in the 130s.  However his diastolic blood pressure has been averaging between 90 and 100.  He has been under more stress at work and recently resigned his position which may help his stress level.  However his blood pressures consistently elevated in the diastolic range.  He denies any chest pain shortness of breath or dyspnea on exertion.  He does have a suspicious lesion on his left forehead just above his eyebrow.  Is approximately 2 to 3 mm in diameter.  Is a very shallow ulcer just anterior to a skin colored papule that appears to be sebaceous hyperplasia.  He states that is been there for more than a month and it is not improving or changing.  Past Medical History:  Diagnosis Date  . Hypertension    Past Surgical History:  Procedure Laterality Date  . HERNIA REPAIR     53 years old  . nose repair    . ROTATOR CUFF REPAIR Right 2009   Dr. Daylene Katayama   Current Outpatient Medications on File Prior to Visit  Medication Sig Dispense Refill  . ALPRAZolam (XANAX) 0.5 MG tablet TAKE 1 TABLET BY MOUTH EVERY EVENING AT BEDTIME AS NEEDED FOR ANXIETY 30 tablet 1  . ALPRAZolam (XANAX) 1 MG tablet TAKE 1/2 TABLET BY MOUTH EVERY EVENING AT BEDTIME AS NEEDED FOR ANXIETY 15 tablet 1  . loratadine (CLARITIN) 10 MG tablet Take 1 tablet (10 mg total) by mouth daily. 90 tablet 3  . losartan-hydrochlorothiazide (HYZAAR) 100-25 MG tablet TAKE 1 TABLET BY MOUTH EVERY DAY 90 tablet 2  . mometasone (NASONEX) 50 MCG/ACT nasal spray SPRAY 2 SPRAYS INTO EACH NOSTRIL EVERY DAY 51 g 1  . multivitamin-iron-minerals-folic acid (CENTRUM) chewable tablet Chew 1 tablet by mouth daily.    . VENTOLIN HFA  108 (90 Base) MCG/ACT inhaler INHALE 2 PUFFS INTO THE LUNGS EVERY 4 HOURS AS NEEDED FOR WHEEZING/SHORTNESS OF BREATH/COUGHING 18 Inhaler 3   No current facility-administered medications on file prior to visit.    No Known Allergies Social History   Socioeconomic History  . Marital status: Married    Spouse name: Not on file  . Number of children: Not on file  . Years of education: Not on file  . Highest education level: Not on file  Occupational History  . Not on file  Social Needs  . Financial resource strain: Not on file  . Food insecurity    Worry: Not on file    Inability: Not on file  . Transportation needs    Medical: Not on file    Non-medical: Not on file  Tobacco Use  . Smoking status: Current Every Day Smoker    Packs/day: 0.25    Types: E-cigarettes  . Smokeless tobacco: Never Used  Substance and Sexual Activity  . Alcohol use: Yes    Alcohol/week: 12.0 standard drinks    Types: 12 Cans of beer per week    Comment: "6 pack of beer daily"  . Drug use: No  . Sexual activity: Yes  Lifestyle  . Physical activity    Days per week: Not on file    Minutes per session: Not on  file  . Stress: Not on file  Relationships  . Social Herbalist on phone: Not on file    Gets together: Not on file    Attends religious service: Not on file    Active member of club or organization: Not on file    Attends meetings of clubs or organizations: Not on file    Relationship status: Not on file  . Intimate partner violence    Fear of current or ex partner: Not on file    Emotionally abused: Not on file    Physically abused: Not on file    Forced sexual activity: Not on file  Other Topics Concern  . Not on file  Social History Narrative  . Not on file     Review of Systems  All other systems reviewed and are negative.      Objective:   Physical Exam  Constitutional: He appears well-developed and well-nourished.  HENT:  Head:    Neck: Neck supple. No  JVD present.  Cardiovascular: Normal rate, regular rhythm and normal heart sounds.  Pulmonary/Chest: Effort normal and breath sounds normal. No respiratory distress. He has no wheezes. He has no rales.  Abdominal: Soft. Bowel sounds are normal.  Lymphadenopathy:    He has no cervical adenopathy.  Vitals reviewed.         Assessment & Plan:  1. Benign essential HTN Blood pressure today is elevated.  I would increase Hyzaar to 1 whole tablet every day and then monitor his blood pressure.  As long as his systolic blood pressure is less than 161 and his diastolic blood pressures less than 90 I would make no further changes.  Check a CBC, CMP, and fasting lipid panel. - CBC with Differential/Platelet - COMPLETE METABOLIC PANEL WITH GFR - Lipid panel  2. Skin lesion of face I am concerned that the lesion on his face may be a precancerous skin lesion.  I recommended cryotherapy with liquid nitrogen which we completed today in the office for a total of 30 seconds.  If lesion persist I would recommend a shave biopsy.

## 2019-05-08 LAB — COMPLETE METABOLIC PANEL WITH GFR
AG Ratio: 2.4 (calc) (ref 1.0–2.5)
ALT: 39 U/L (ref 9–46)
AST: 22 U/L (ref 10–35)
Albumin: 4.6 g/dL (ref 3.6–5.1)
Alkaline phosphatase (APISO): 65 U/L (ref 35–144)
BUN: 19 mg/dL (ref 7–25)
CO2: 23 mmol/L (ref 20–32)
Calcium: 9.4 mg/dL (ref 8.6–10.3)
Chloride: 106 mmol/L (ref 98–110)
Creat: 1.07 mg/dL (ref 0.70–1.33)
GFR, Est African American: 91 mL/min/{1.73_m2} (ref >=60)
GFR, Est Non African American: 79 mL/min/{1.73_m2} (ref >=60)
Globulin: 1.9 g/dL (calc) (ref 1.9–3.7)
Glucose, Bld: 102 mg/dL — ABNORMAL HIGH (ref 65–99)
Potassium: 3.9 mmol/L (ref 3.5–5.3)
Sodium: 139 mmol/L (ref 135–146)
Total Bilirubin: 0.8 mg/dL (ref 0.2–1.2)
Total Protein: 6.5 g/dL (ref 6.1–8.1)

## 2019-05-08 LAB — CBC WITH DIFFERENTIAL/PLATELET
Absolute Monocytes: 548 cells/uL (ref 200–950)
Basophils Absolute: 52 cells/uL (ref 0–200)
Basophils Relative: 0.7 %
Eosinophils Absolute: 148 cells/uL (ref 15–500)
Eosinophils Relative: 2 %
HCT: 47 % (ref 38.5–50.0)
Hemoglobin: 16.2 g/dL (ref 13.2–17.1)
Lymphs Abs: 1724 cells/uL (ref 850–3900)
MCH: 32.1 pg (ref 27.0–33.0)
MCHC: 34.5 g/dL (ref 32.0–36.0)
MCV: 93.3 fL (ref 80.0–100.0)
MPV: 9.3 fL (ref 7.5–12.5)
Monocytes Relative: 7.4 %
Neutro Abs: 4928 cells/uL (ref 1500–7800)
Neutrophils Relative %: 66.6 %
Platelets: 236 10*3/uL (ref 140–400)
RBC: 5.04 10*6/uL (ref 4.20–5.80)
RDW: 12.7 % (ref 11.0–15.0)
Total Lymphocyte: 23.3 %
WBC: 7.4 10*3/uL (ref 3.8–10.8)

## 2019-05-08 LAB — LIPID PANEL
Cholesterol: 227 mg/dL — ABNORMAL HIGH (ref <200)
HDL: 48 mg/dL (ref >=40)
LDL Cholesterol (Calc): 148 mg/dL (calc) — ABNORMAL HIGH
Non-HDL Cholesterol (Calc): 179 mg/dL (calc) — ABNORMAL HIGH (ref <130)
Total CHOL/HDL Ratio: 4.7 (calc) (ref <5.0)
Triglycerides: 174 mg/dL — ABNORMAL HIGH (ref <150)

## 2019-05-14 ENCOUNTER — Other Ambulatory Visit: Payer: Self-pay | Admitting: Family Medicine

## 2019-05-18 ENCOUNTER — Telehealth: Payer: Self-pay | Admitting: Family Medicine

## 2019-05-18 NOTE — Telephone Encounter (Signed)
Adela Lank, patients wife called in asking about a bill they received from [place] [facility]. She states that patient had labs drawn on 05/07/2019 he had Lipid, CMP, and CBC she states these were supposed to be his annual lab work and the only DX code that shows on the bill is I10 hypertension. Are there additional codes you would like to add for these labs?  Please advise.  Invoice # [id number] Billed Amt. $303.04    CB# [phone]

## 2019-05-18 NOTE — Telephone Encounter (Signed)
This pt was not scheduled for a physical and the note says nothing about a physical. He was here for a check up of his blood pressure. He does not have any other dx codes in chart. We could do medication monitoring (z79.899) but not sure that would help with coverage. Is [employer] not covering the labs?

## 2019-05-18 NOTE — Telephone Encounter (Signed)
Per his wife they didn't cover anything. She states that he has labs done annually and that is why she asked about the annual CPT code.

## 2019-05-20 ENCOUNTER — Ambulatory Visit: Payer: Self-pay | Admitting: Family Medicine

## 2019-05-26 NOTE — Telephone Encounter (Signed)
Spoke to [employer] in lab and she reached out to [employer] and they did not have his updated insurance information in the computer. Looks like it did not get updated in Epic at his lov. Left her a message that [facility] was going to resubmit ins and she should hear back form them and to let me know if I can do anything else.

## 2019-06-08 ENCOUNTER — Other Ambulatory Visit: Payer: Self-pay | Admitting: Family Medicine

## 2019-06-24 ENCOUNTER — Ambulatory Visit: Payer: 59 | Admitting: Family Medicine

## 2019-07-27 ENCOUNTER — Other Ambulatory Visit: Payer: Self-pay

## 2019-07-27 DIAGNOSIS — F5101 Primary insomnia: Secondary | ICD-10-CM

## 2019-07-27 MED ORDER — ALPRAZOLAM 0.5 MG PO TABS: ORAL_TABLET | ORAL | 1 refills | Status: DC

## 2019-07-27 NOTE — Telephone Encounter (Signed)
Requested Prescriptions   Pending Prescriptions Disp Refills  . ALPRAZolam (XANAX) 0.5 MG tablet 30 tablet 1    Sig: TAKE 1 TABLET BY MOUTH EVERY EVENING AT BEDTIME AS NEEDED FOR ANXIETY    Last OV 05/07/2019  Last written 05/07/2019

## 2019-09-26 ENCOUNTER — Other Ambulatory Visit: Payer: Self-pay | Admitting: Family Medicine

## 2019-09-26 DIAGNOSIS — F5101 Primary insomnia: Secondary | ICD-10-CM

## 2019-09-27 NOTE — Telephone Encounter (Signed)
Ok to refill??  Last office visit 05/07/2019.  Last refill 07/27/2019,. #1 refill.

## 2019-11-29 ENCOUNTER — Other Ambulatory Visit: Payer: Self-pay | Admitting: Family Medicine

## 2019-11-29 DIAGNOSIS — F5101 Primary insomnia: Secondary | ICD-10-CM

## 2019-11-29 NOTE — Telephone Encounter (Signed)
Ok to refill??  Last office visit 05/07/2019.  Last refill 09/27/2019, #1 refill.

## 2020-01-30 ENCOUNTER — Other Ambulatory Visit: Payer: Self-pay | Admitting: Family Medicine

## 2020-01-30 DIAGNOSIS — F5101 Primary insomnia: Secondary | ICD-10-CM

## 2020-01-31 NOTE — Telephone Encounter (Signed)
Ok to refill??  Last office visit 05/07/2019.  Last refill 11/29/2019, #1 refill.

## 2020-04-04 ENCOUNTER — Other Ambulatory Visit: Payer: Self-pay | Admitting: Family Medicine

## 2020-04-04 DIAGNOSIS — F5101 Primary insomnia: Secondary | ICD-10-CM

## 2020-04-04 NOTE — Telephone Encounter (Signed)
Ok to refill??  Last office visit 05/07/2019.  Last refill 01/31/2020, #1 refill.

## 2020-05-16 ENCOUNTER — Other Ambulatory Visit: Payer: Self-pay

## 2020-05-16 DIAGNOSIS — I10 Essential (primary) hypertension: Secondary | ICD-10-CM

## 2020-05-16 MED ORDER — LOSARTAN POTASSIUM-HCTZ 100-25 MG PO TABS: 1.0000 | ORAL_TABLET | Freq: Every day | ORAL | 0 refills | Status: DC

## 2020-05-17 ENCOUNTER — Other Ambulatory Visit: Payer: Self-pay

## 2020-05-17 MED ORDER — ALBUTEROL SULFATE HFA 108 (90 BASE) MCG/ACT IN AERS: INHALATION_SPRAY | RESPIRATORY_TRACT | 0 refills | Status: DC

## 2020-06-02 ENCOUNTER — Ambulatory Visit (INDEPENDENT_AMBULATORY_CARE_PROVIDER_SITE_OTHER): Payer: Self-pay | Admitting: Family Medicine

## 2020-06-02 ENCOUNTER — Other Ambulatory Visit: Payer: Self-pay

## 2020-06-02 VITALS — BP 130/80 | HR 107 | Temp 97.6°F | Wt 202.0 lb

## 2020-06-02 DIAGNOSIS — I10 Essential (primary) hypertension: Secondary | ICD-10-CM

## 2020-06-02 DIAGNOSIS — Z125 Encounter for screening for malignant neoplasm of prostate: Secondary | ICD-10-CM | POA: Diagnosis not present

## 2020-06-02 NOTE — Progress Notes (Signed)
Subjective:    Patient ID: Mark Guzman, male    DOB: December 24, 1965, 54 y.o.   MRN: [id number]  Hypertension  04/2019 Patient is a very pleasant 54 year old Caucasian gentleman who presents today with hypertension.  He is taking one half of Hyzaar every day for high blood pressure.  Recently his systolic blood pressure has consistently been in the 130s.  However his diastolic blood pressure has been averaging between 90 and 100.  He has been under more stress at work and recently resigned his position which may help his stress level.  However his blood pressures consistently elevated in the diastolic range.  He denies any chest pain shortness of breath or dyspnea on exertion.  He does have a suspicious lesion on his left forehead just above his eyebrow.  Is approximately 2 to 3 mm in diameter.  Is a very shallow ulcer just anterior to a skin colored papule that appears to be sebaceous hyperplasia.  He states that is been there for more than a month and it is not improving or changing.  At that time, my plan was: 1. Benign essential HTN Blood pressure today is elevated.  I would increase Hyzaar to 1 whole tablet every day and then monitor his blood pressure.  As long as his systolic blood pressure is less than 563 and his diastolic blood pressures less than 90 I would make no further changes.  Check a CBC, CMP, and fasting lipid panel. - CBC with Differential/Platelet - COMPLETE METABOLIC PANEL WITH GFR - Lipid panel  2. Skin lesion of face I am concerned that the lesion on his face may be a precancerous skin lesion.  I recommended cryotherapy with liquid nitrogen which we completed today in the office for a total of 30 seconds.  If lesion persist I would recommend a shave biopsy.  06/02/20 Patient states that his blood pressure at home tends to run 130/90-95.  He is still only taking half a blood pressure pill a day.  He denies any chest pain shortness of breath or dyspnea on exertion.  He has  drastically reduced his smoking and is smoking about 5 cigarettes a day.  He denies any cough or hemoptysis.  He is due for prostate cancer screening.  He is due for a colonoscopy.  He states that he is already in the process of scheduling his colonoscopy in [place].  He does consent to PSA today.  Unfortunately the lesion on his left forehead persist.  Is roughly a 4 mm slightly ulcerated scaly area.  It did not resolve after cryotherapy.  I am concerned about squamous cell carcinoma.  We had a discussion today about a referral to dermatology for excision.  Patient is reluctant at the present time for dermatology referral.  I strongly encourage that.  He has not had his Covid shot.  I have recommended that as well  Past Medical History:  Diagnosis Date  . Hypertension    Past Surgical History:  Procedure Laterality Date  . HERNIA REPAIR     54 years old  . nose repair    . ROTATOR CUFF REPAIR Right 2009   Dr. Daylene Katayama   Current Outpatient Medications on File Prior to Visit  Medication Sig Dispense Refill  . albuterol (VENTOLIN HFA) 108 (90 Base) MCG/ACT inhaler INHALE 2 PUFFS INTO THE LUNGS EVERY 4 HOURS AS NEEDED FOR WHEEZING/SHORTNESS OF BREATH/COUGHING 18 g 0  . ALPRAZolam (XANAX) 0.5 MG tablet TAKE 1 TABLET BY MOUTH EVERY EVENING  AT BEDTIME AS NEEDED FOR ANXIETY 30 tablet 1  . loratadine (CLARITIN) 10 MG tablet TAKE 1 TABLET BY MOUTH EVERY DAY 90 tablet 2  . losartan-hydrochlorothiazide (HYZAAR) 100-25 MG tablet Take 1 tablet by mouth daily. 60 tablet 0  . mometasone (NASONEX) 50 MCG/ACT nasal spray SPRAY 2 SPRAYS INTO EACH NOSTRIL EVERY DAY 17 g 1  . multivitamin-iron-minerals-folic acid (CENTRUM) chewable tablet Chew 1 tablet by mouth daily.     No current facility-administered medications on file prior to visit.   No Known Allergies Social History   Socioeconomic History  . Marital status: Married    Spouse name: Not on file  . Number of children: Not on file  . Years of  education: Not on file  . Highest education level: Not on file  Occupational History  . Not on file  Tobacco Use  . Smoking status: Current Every Day Smoker    Packs/day: 0.25    Types: E-cigarettes  . Smokeless tobacco: Never Used  Vaping Use  . Vaping Use: Every day  Substance and Sexual Activity  . Alcohol use: Yes    Alcohol/week: 12.0 standard drinks    Types: 12 Cans of beer per week    Comment: "6 pack of beer daily"  . Drug use: No  . Sexual activity: Yes  Other Topics Concern  . Not on file  Social History Narrative  . Not on file   Social Determinants of Health   Financial Resource Strain:   . Difficulty of Paying Living Expenses:   Food Insecurity:   . Worried About Charity fundraiser in the Last Year:   . Arboriculturist in the Last Year:   Transportation Needs:   . Film/video editor (Medical):   Marland Kitchen Lack of Transportation (Non-Medical):   Physical Activity:   . Days of Exercise per Week:   . Minutes of Exercise per Session:   Stress:   . Feeling of Stress :   Social Connections:   . Frequency of Communication with Friends and Family:   . Frequency of Social Gatherings with Friends and Family:   . Attends Religious Services:   . Active Member of Clubs or Organizations:   . Attends Archivist Meetings:   Marland Kitchen Marital Status:   Intimate Partner Violence:   . Fear of Current or Ex-Partner:   . Emotionally Abused:   Marland Kitchen Physically Abused:   . Sexually Abused:      Review of Systems  All other systems reviewed and are negative.      Objective:   Physical Exam Vitals reviewed.  Constitutional:      Appearance: He is well-developed.  HENT:     Head:   Neck:     Vascular: No JVD.  Cardiovascular:     Rate and Rhythm: Normal rate and regular rhythm.     Heart sounds: Normal heart sounds.  Pulmonary:     Effort: Pulmonary effort is normal. No respiratory distress.     Breath sounds: Normal breath sounds. No wheezing or rales.    Abdominal:     General: Bowel sounds are normal.     Palpations: Abdomen is soft.  Musculoskeletal:     Cervical back: Neck supple.  Lymphadenopathy:     Cervical: No cervical adenopathy.           Assessment & Plan:   Benign essential HTN - Plan: CBC with Differential/Platelet, COMPLETE METABOLIC PANEL WITH GFR, Lipid panel  Prostate cancer  screening - Plan: PSA  Blood pressure here is well controlled however I would like him to increase Hyzaar to 1 tablet a day to try to get his diastolic blood pressure below 90.  I will check a CBC, CMP, fasting lipid panel.  Goal LDL cholesterol is less than 100.  I am proud of the patient for cutting back on his smoking.  I recommended total smoking cessation.  I will check a PSA for prostate cancer screening.  Patient is already scheduling his colonoscopy which I congratulated him on doing.  Strongly recommended the Covid shot.  Also strongly recommended a dermatology referral to remove the lesion on his left forehead that I believe is cancerous.  Patient declines at the present time

## 2020-06-03 LAB — PSA: PSA: 0.8 ng/mL (ref <=4.0)

## 2020-06-03 LAB — COMPLETE METABOLIC PANEL WITH GFR
AG Ratio: 2.2 (calc) (ref 1.0–2.5)
ALT: 46 U/L (ref 9–46)
AST: 28 U/L (ref 10–35)
Albumin: 4.4 g/dL (ref 3.6–5.1)
Alkaline phosphatase (APISO): 72 U/L (ref 35–144)
BUN: 20 mg/dL (ref 7–25)
CO2: 28 mmol/L (ref 20–32)
Calcium: 9.6 mg/dL (ref 8.6–10.3)
Chloride: 104 mmol/L (ref 98–110)
Creat: 1.25 mg/dL (ref 0.70–1.33)
GFR, Est African American: 75 mL/min/{1.73_m2} (ref >=60)
GFR, Est Non African American: 65 mL/min/{1.73_m2} (ref >=60)
Globulin: 2 g/dL (calc) (ref 1.9–3.7)
Glucose, Bld: 100 mg/dL — ABNORMAL HIGH (ref 65–99)
Potassium: 4.9 mmol/L (ref 3.5–5.3)
Sodium: 140 mmol/L (ref 135–146)
Total Bilirubin: 0.8 mg/dL (ref 0.2–1.2)
Total Protein: 6.4 g/dL (ref 6.1–8.1)

## 2020-06-03 LAB — LIPID PANEL
Cholesterol: 204 mg/dL — ABNORMAL HIGH (ref <200)
HDL: 46 mg/dL (ref >=40)
LDL Cholesterol (Calc): 125 mg/dL (calc) — ABNORMAL HIGH
Non-HDL Cholesterol (Calc): 158 mg/dL (calc) — ABNORMAL HIGH (ref <130)
Total CHOL/HDL Ratio: 4.4 (calc) (ref <5.0)
Triglycerides: 214 mg/dL — ABNORMAL HIGH (ref <150)

## 2020-06-03 LAB — CBC WITH DIFFERENTIAL/PLATELET
Absolute Monocytes: 502 cells/uL (ref 200–950)
Basophils Absolute: 40 cells/uL (ref 0–200)
Basophils Relative: 0.7 %
Eosinophils Absolute: 251 cells/uL (ref 15–500)
Eosinophils Relative: 4.4 %
HCT: 47.5 % (ref 38.5–50.0)
Hemoglobin: 16.3 g/dL (ref 13.2–17.1)
Lymphs Abs: 1818 cells/uL (ref 850–3900)
MCH: 32.9 pg (ref 27.0–33.0)
MCHC: 34.3 g/dL (ref 32.0–36.0)
MCV: 95.8 fL (ref 80.0–100.0)
MPV: 9.3 fL (ref 7.5–12.5)
Monocytes Relative: 8.8 %
Neutro Abs: 3089 cells/uL (ref 1500–7800)
Neutrophils Relative %: 54.2 %
Platelets: 225 10*3/uL (ref 140–400)
RBC: 4.96 10*6/uL (ref 4.20–5.80)
RDW: 12.7 % (ref 11.0–15.0)
Total Lymphocyte: 31.9 %
WBC: 5.7 10*3/uL (ref 3.8–10.8)

## 2020-06-14 ENCOUNTER — Other Ambulatory Visit: Payer: Self-pay | Admitting: Family Medicine

## 2020-06-14 DIAGNOSIS — F5101 Primary insomnia: Secondary | ICD-10-CM

## 2020-06-14 NOTE — Telephone Encounter (Signed)
Ok to refill??  Last office visit 06/02/2020.  Last refill 04/04/2020, #1 refill.

## 2020-07-03 ENCOUNTER — Telehealth: Payer: Self-pay | Admitting: Family Medicine

## 2020-07-03 DIAGNOSIS — Z1211 Encounter for screening for malignant neoplasm of colon: Secondary | ICD-10-CM

## 2020-07-03 NOTE — Telephone Encounter (Signed)
Mark Guzman wife requested a referral for patient to [facility].  CB# [phone]

## 2020-07-12 ENCOUNTER — Other Ambulatory Visit: Payer: Self-pay | Admitting: Family Medicine

## 2020-07-12 DIAGNOSIS — I10 Essential (primary) hypertension: Secondary | ICD-10-CM

## 2020-07-18 NOTE — Telephone Encounter (Signed)
Call placed to patient wife.   Reports that patient is needing screening colonoscopy.   Referral orders placed.

## 2020-07-18 NOTE — Telephone Encounter (Signed)
I am ok with referral but for what?

## 2020-07-24 ENCOUNTER — Encounter: Payer: Self-pay | Admitting: Internal Medicine

## 2020-08-15 ENCOUNTER — Other Ambulatory Visit: Payer: Self-pay | Admitting: Family Medicine

## 2020-08-15 DIAGNOSIS — F5101 Primary insomnia: Secondary | ICD-10-CM

## 2020-08-15 NOTE — Telephone Encounter (Signed)
Ok to refill??  Last office visit 06/02/2020.  Last refill 06/15/2020, #1 refill.

## 2020-08-18 ENCOUNTER — Ambulatory Visit (INDEPENDENT_AMBULATORY_CARE_PROVIDER_SITE_OTHER): Payer: BC Managed Care – PPO | Admitting: Family Medicine

## 2020-08-18 ENCOUNTER — Other Ambulatory Visit: Payer: Self-pay

## 2020-08-18 VITALS — BP 120/70 | HR 95 | Temp 98.2°F | Ht 69.0 in | Wt 197.0 lb

## 2020-08-18 DIAGNOSIS — L989 Disorder of the skin and subcutaneous tissue, unspecified: Secondary | ICD-10-CM | POA: Diagnosis not present

## 2020-08-18 DIAGNOSIS — M67449 Ganglion, unspecified hand: Secondary | ICD-10-CM

## 2020-08-18 DIAGNOSIS — B079 Viral wart, unspecified: Secondary | ICD-10-CM | POA: Diagnosis not present

## 2020-08-18 NOTE — Progress Notes (Signed)
Subjective:    Patient ID: Mark Guzman, male    DOB: [birth year], 54 y.o.   MRN: [id number]  Patient presents today with 2 concerns.  He has a lesion growing on the dorsal surface of his right index finger.  It is just distal to the DIP joint.  Is a clear mucus filled cyst.  It is not painful.  It is about 4 mm to 5 mm in diameter.  See photograph below.  It clearly appears to be a digital mucous cyst.  He also has a wart on the same finger over the PIP joint.  This is 5 mm in diameter.  Is a white hyperkeratotic papule with wartlike features.  He would also like to try to freeze the precancerous lesion on his left temple.      Past Medical History:  Diagnosis Date  . Hypertension    Past Surgical History:  Procedure Laterality Date  . HERNIA REPAIR     54 years old  . nose repair    . ROTATOR CUFF REPAIR Right 2009   Dr. Daylene Katayama   Current Outpatient Medications on File Prior to Visit  Medication Sig Dispense Refill  . albuterol (VENTOLIN HFA) 108 (90 Base) MCG/ACT inhaler INHALE 2 PUFFS INTO THE LUNGS EVERY 4 HOURS AS NEEDED FOR WHEEZING/SHORTNESS OF BREATH/COUGHING 18 g 0  . ALPRAZolam (XANAX) 0.5 MG tablet TAKE 1 TABLET BY MOUTH EVERY EVENING AT BEDTIME AS NEEDED FOR ANXIETY 30 tablet 1  . loratadine (CLARITIN) 10 MG tablet TAKE 1 TABLET BY MOUTH EVERY DAY 90 tablet 2  . losartan-hydrochlorothiazide (HYZAAR) 100-25 MG tablet TAKE 1 TABLET BY MOUTH EVERY DAY 60 tablet 0  . mometasone (NASONEX) 50 MCG/ACT nasal spray SPRAY 2 SPRAYS INTO EACH NOSTRIL EVERY DAY 17 g 1  . multivitamin-iron-minerals-folic acid (CENTRUM) chewable tablet Chew 1 tablet by mouth daily.     No current facility-administered medications on file prior to visit.   No Known Allergies Social History   Socioeconomic History  . Marital status: Married    Spouse name: Not on file  . Number of children: Not on file  . Years of education: Not on file  . Highest education level: Not on file  Occupational  History  . Not on file  Tobacco Use  . Smoking status: Current Every Day Smoker    Packs/day: 0.25    Types: E-cigarettes  . Smokeless tobacco: Never Used  Vaping Use  . Vaping Use: Every day  Substance and Sexual Activity  . Alcohol use: Yes    Alcohol/week: 12.0 standard drinks    Types: 12 Cans of beer per week    Comment: "6 pack of beer daily"  . Drug use: No  . Sexual activity: Yes  Other Topics Concern  . Not on file  Social History Narrative  . Not on file   Social Determinants of Health   Financial Resource Strain:   . Difficulty of Paying Living Expenses: Not on file  Food Insecurity:   . Worried About Charity fundraiser in the Last Year: Not on file  . Ran Out of Food in the Last Year: Not on file  Transportation Needs:   . Lack of Transportation (Medical): Not on file  . Lack of Transportation (Non-Medical): Not on file  Physical Activity:   . Days of Exercise per Week: Not on file  . Minutes of Exercise per Session: Not on file  Stress:   . Feeling of Stress : Not  on file  Social Connections:   . Frequency of Communication with Friends and Family: Not on file  . Frequency of Social Gatherings with Friends and Family: Not on file  . Attends Religious Services: Not on file  . Active Member of Clubs or Organizations: Not on file  . Attends Archivist Meetings: Not on file  . Marital Status: Not on file  Intimate Partner Violence:   . Fear of Current or Ex-Partner: Not on file  . Emotionally Abused: Not on file  . Physically Abused: Not on file  . Sexually Abused: Not on file     Review of Systems  All other systems reviewed and are negative.      Objective:   Physical Exam Vitals reviewed.  Constitutional:      Appearance: He is well-developed.  HENT:     Head:   Neck:     Vascular: No JVD.  Cardiovascular:     Rate and Rhythm: Normal rate and regular rhythm.     Heart sounds: Normal heart sounds.  Pulmonary:     Effort:  Pulmonary effort is normal. No respiratory distress.     Breath sounds: Normal breath sounds. No wheezing or rales.  Abdominal:     General: Bowel sounds are normal.     Palpations: Abdomen is soft.  Musculoskeletal:     Cervical back: Neck supple.  Lymphadenopathy:     Cervical: No cervical adenopathy.    Please see the photograph above which shows location of the cancerous lesion on his left temple as well as the digital mucous cyst on the dorsum of his right index finger and the wart on the dorsal surface of his PIP joint on his right index finger       Assessment & Plan:  Skin lesion of face  Digital mucous cyst  Viral warts, unspecified type  I treated the lesion on the left side of his face with liquid nitrogen cryotherapy for a total of 30 seconds.  I treated the wart on the dorsal surface of the PIP joint with liquid nitrogen cryotherapy for a total of 30 seconds.  I recommended a referral to a hand specialist to remove the digital mucous cyst.  Patient elects just to monitor it for the present time.

## 2020-09-05 ENCOUNTER — Other Ambulatory Visit: Payer: Self-pay | Admitting: Family Medicine

## 2020-09-07 ENCOUNTER — Other Ambulatory Visit: Payer: Self-pay | Admitting: Family Medicine

## 2020-09-07 DIAGNOSIS — I10 Essential (primary) hypertension: Secondary | ICD-10-CM

## 2020-09-12 ENCOUNTER — Other Ambulatory Visit: Payer: Self-pay

## 2020-09-12 ENCOUNTER — Ambulatory Visit (INDEPENDENT_AMBULATORY_CARE_PROVIDER_SITE_OTHER): Payer: BC Managed Care – PPO | Admitting: Gastroenterology

## 2020-09-12 ENCOUNTER — Encounter: Payer: Self-pay | Admitting: Gastroenterology

## 2020-09-12 DIAGNOSIS — Z1212 Encounter for screening for malignant neoplasm of rectum: Secondary | ICD-10-CM

## 2020-09-12 DIAGNOSIS — Z1211 Encounter for screening for malignant neoplasm of colon: Secondary | ICD-10-CM

## 2020-09-12 NOTE — Patient Instructions (Signed)
1. Colonoscopy with Dr. Rourk. See separate instructions.  

## 2020-09-12 NOTE — Progress Notes (Signed)
Primary Care Physician:  Susy Frizzle, MD  Primary Gastroenterologist:  Garfield Cornea, MD   Chief Complaint  Patient presents with  . Colonoscopy    never had tcs; no problems    HPI:  Mark Guzman is a 54 y.o. male here to schedule first-ever screening colonoscopy.  Brought in for an office visit to discuss sedation given daily alcohol use.  Patient denies any GI complaints.  States he has a history of hemorrhoids.  Rarely bothered by them.  Bowel movements are regular.  No blood in the stool or melena.  Occasional heartburn with certain foods.  No vomiting or dysphagia.  No unintentional weight loss.  Current Outpatient Medications  Medication Sig Dispense Refill  . albuterol (VENTOLIN HFA) 108 (90 Base) MCG/ACT inhaler INHALE 2 PUFFS INTO THE LUNGS EVERY 4 HOURS AS NEEDED FOR WHEEZING/SHORTNESS OF BREATH/COUGHING 18 g 0  . ALPRAZolam (XANAX) 0.5 MG tablet TAKE 1 TABLET BY MOUTH EVERY EVENING AT BEDTIME AS NEEDED FOR ANXIETY 30 tablet 1  . loratadine (CLARITIN) 10 MG tablet TAKE 1 TABLET BY MOUTH EVERY DAY 90 tablet 0  . losartan-hydrochlorothiazide (HYZAAR) 100-25 MG tablet TAKE 1 TABLET BY MOUTH EVERY DAY 60 tablet 0  . multivitamin-iron-minerals-folic acid (CENTRUM) chewable tablet Chew 1 tablet by mouth daily.     No current facility-administered medications for this visit.    Allergies as of 09/12/2020  . (No Known Allergies)    Past Medical History:  Diagnosis Date  . Hypertension     Past Surgical History:  Procedure Laterality Date  . ANKLE SURGERY Left   . HERNIA REPAIR     54 years old  . nose repair     traumatic injury  . ROTATOR CUFF REPAIR Right 2009   Dr. Daylene Katayama    Family History  Problem Relation Age of Onset  . Arthritis Father   . Colon cancer Neg Hx     Social History   Socioeconomic History  . Marital status: Married    Spouse name: Not on file  . Number of children: Not on file  . Years of education: Not on file  . Highest  education level: Not on file  Occupational History  . Not on file  Tobacco Use  . Smoking status: Current Every Day Smoker    Packs/day: 0.25    Types: Cigarettes  . Smokeless tobacco: Never Used  Vaping Use  . Vaping Use: Every day  Substance and Sexual Activity  . Alcohol use: Yes    Comment: 4-6 beer daily  . Drug use: No  . Sexual activity: Yes  Other Topics Concern  . Not on file  Social History Narrative  . Not on file   Social Determinants of Health   Financial Resource Strain:   . Difficulty of Paying Living Expenses: Not on file  Food Insecurity:   . Worried About Charity fundraiser in the Last Year: Not on file  . Ran Out of Food in the Last Year: Not on file  Transportation Needs:   . Lack of Transportation (Medical): Not on file  . Lack of Transportation (Non-Medical): Not on file  Physical Activity:   . Days of Exercise per Week: Not on file  . Minutes of Exercise per Session: Not on file  Stress:   . Feeling of Stress : Not on file  Social Connections:   . Frequency of Communication with Friends and Family: Not on file  . Frequency of Social Gatherings with  Friends and Family: Not on file  . Attends Religious Services: Not on file  . Active Member of Clubs or Organizations: Not on file  . Attends Archivist Meetings: Not on file  . Marital Status: Not on file  Intimate Partner Violence:   . Fear of Current or Ex-Partner: Not on file  . Emotionally Abused: Not on file  . Physically Abused: Not on file  . Sexually Abused: Not on file      ROS:  General: Negative for anorexia, weight loss, fever, chills, fatigue, weakness. Eyes: Negative for vision changes.  ENT: Negative for hoarseness, difficulty swallowing , nasal congestion. CV: Negative for chest pain, angina, palpitations, dyspnea on exertion, peripheral edema.  Respiratory: Negative for dyspnea at rest, dyspnea on exertion, cough, sputum, wheezing.  GI: See history of present  illness. GU:  Negative for dysuria, hematuria, urinary incontinence, urinary frequency, nocturnal urination.  MS: Negative for joint pain, low back pain.  Derm: Negative for rash or itching.  Neuro: Negative for weakness, abnormal sensation, seizure, frequent headaches, memory loss, confusion.  Psych: Negative for anxiety, depression, suicidal ideation, hallucinations.  Endo: Negative for unusual weight change.  Heme: Negative for bruising or bleeding. Allergy: Negative for rash or hives.    Physical Examination:  BP (!) 138/96   Pulse 83   Temp (!) 97.1 F (36.2 C) (Temporal)   Ht 5\' 9"  (1.753 m)   Wt 202 lb 6.4 oz (91.8 kg)   BMI 29.89 kg/m    General: Well-nourished, well-developed in no acute distress.  Head: Normocephalic, atraumatic.   Eyes: Conjunctiva pink, no icterus. Mouth: masked Neck: Supple without thyromegaly, masses, or lymphadenopathy.  Lungs: Clear to auscultation bilaterally.  Heart: Regular rate and rhythm, no murmurs rubs or gallops.  Abdomen: Bowel sounds are normal, nontender, nondistended, no hepatosplenomegaly or masses, no abdominal bruits or    hernia , no rebound or guarding.   Rectal: Not performed Extremities: No lower extremity edema. No clubbing or deformities.  Neuro: Alert and oriented x 4 , grossly normal neurologically.  Skin: Warm and dry, no rash or jaundice.   Psych: Alert and cooperative, normal mood and affect.  Labs: Lab Results  Component Value Date   CREATININE 1.25 06/02/2020   BUN 20 06/02/2020   NA 140 06/02/2020   K 4.9 06/02/2020   CL 104 06/02/2020   CO2 28 06/02/2020   Lab Results  Component Value Date   ALT 46 06/02/2020   AST 28 06/02/2020   ALKPHOS 82 11/13/2015   BILITOT 0.8 06/02/2020   Lab Results  Component Value Date   WBC 5.7 06/02/2020   HGB 16.3 06/02/2020   HCT 47.5 06/02/2020   MCV 95.8 06/02/2020   PLT 225 06/02/2020     Imaging Studies: No results found.  Impression/plan:  54 year old  gentleman presenting to schedule first-ever screening colonoscopy.  Doing well from a GI standpoint.  Because of daily alcohol consumption he will require deep sedation with assistance of anesthesiology. ASA III.  I have discussed the risks, alternatives, benefits with regards to but not limited to the risk of reaction to medication, bleeding, infection, perforation and the patient is agreeable to proceed. Written consent to be obtained.

## 2020-09-20 ENCOUNTER — Telehealth: Payer: Self-pay

## 2020-09-20 NOTE — Telephone Encounter (Signed)
Called pt, TCS w/Prop w/Dr. Gala Romney ASA 3 scheduled for 11/28/20 at 8:00am. He was unable to do 09/26/20.

## 2020-09-20 NOTE — Telephone Encounter (Signed)
Pre-op/covid test 11/24/20. Appt letter mailed with procedure instructions. 

## 2020-09-21 ENCOUNTER — Other Ambulatory Visit: Payer: Self-pay | Admitting: Family Medicine

## 2020-09-21 DIAGNOSIS — I10 Essential (primary) hypertension: Secondary | ICD-10-CM

## 2020-10-19 ENCOUNTER — Other Ambulatory Visit: Payer: Self-pay | Admitting: Family Medicine

## 2020-10-19 DIAGNOSIS — F5101 Primary insomnia: Secondary | ICD-10-CM

## 2020-10-19 NOTE — Telephone Encounter (Signed)
Ok to refill??  Last office visit 08/18/2020.  Last refill 08/15/2020, #1 refill.

## 2020-11-22 NOTE — Patient Instructions (Signed)
Mark Guzman  11/22/2020     @PREFPERIOPPHARMACY @   Your procedure is scheduled on  11/28/2020  Report to [facility] at  [place]  A.M.  Call this number if you have problems the morning of surgery:  [phone]   Remember:  Follow the diet and prep instructions given to you by the office.                    Take these medicines the morning of surgery with A SIP OF WATER  None    Do not wear jewelry, make-up or nail polish.  Do not wear lotions, powders, or perfumes, or deodorant. Please brush your teeth.  Do not shave 48 hours prior to surgery.  Men may shave face and neck.  Do not bring valuables to the hospital.  [facility] is not responsible for any belongings or valuables.  Contacts, dentures or bridgework may not be worn into surgery.  Leave your suitcase in the car.  After surgery it may be brought to your room.  For patients admitted to the hospital, discharge time will be determined by your treatment team.  Patients discharged the day of surgery will not be allowed to drive home.   Name and phone number of your driver:   family Special instructions:  DO NOT smoke the morning of your procedure.  Please read over the following fact sheets that you were given. Anesthesia Post-op Instructions and Care and Recovery After Surgery       Colonoscopy, Adult, Care After This sheet gives you information about how to care for yourself after your procedure. Your health care provider may also give you more specific instructions. If you have problems or questions, contact your health care provider. What can I expect after the procedure? After the procedure, it is common to have:  A small amount of blood in your stool for 24 hours after the procedure.  Some gas.  Mild cramping or bloating of your abdomen. Follow these instructions at home: Eating and drinking  Drink enough fluid to keep your urine pale yellow.  Follow instructions from your health care provider  about eating or drinking restrictions.  Resume your normal diet as instructed by your health care provider. Avoid heavy or fried foods that are hard to digest.   Activity  Rest as told by your health care provider.  Avoid sitting for a long time without moving. Get up to take short walks every 1-2 hours. This is important to improve blood flow and breathing. Ask for help if you feel weak or unsteady.  Return to your normal activities as told by your health care provider. Ask your health care provider what activities are safe for you. Managing cramping and bloating  Try walking around when you have cramps or feel bloated.  Apply heat to your abdomen as told by your health care provider. Use the heat source that your health care provider recommends, such as a moist heat pack or a heating pad. ? Place a towel between your skin and the heat source. ? Leave the heat on for 20-30 minutes. ? Remove the heat if your skin turns bright red. This is especially important if you are unable to feel pain, heat, or cold. You may have a greater risk of getting burned.   General instructions  If you were given a sedative during the procedure, it can affect you for several hours. Do not drive or operate machinery  until your health care provider says that it is safe.  For the first 24 hours after the procedure: ? Do not sign important documents. ? Do not drink alcohol. ? Do your regular daily activities at a slower pace than normal. ? Eat soft foods that are easy to digest.  Take over-the-counter and prescription medicines only as told by your health care provider.  Keep all follow-up visits as told by your health care provider. This is important. Contact a health care provider if:  You have blood in your stool 2-3 days after the procedure. Get help right away if you have:  More than a small spotting of blood in your stool.  Large blood clots in your stool.  Swelling of your abdomen.  Nausea or  vomiting.  A fever.  Increasing pain in your abdomen that is not relieved with medicine. Summary  After the procedure, it is common to have a small amount of blood in your stool. You may also have mild cramping and bloating of your abdomen.  If you were given a sedative during the procedure, it can affect you for several hours. Do not drive or operate machinery until your health care provider says that it is safe.  Get help right away if you have a lot of blood in your stool, nausea or vomiting, a fever, or increased pain in your abdomen. This information is not intended to replace advice given to you by your health care provider. Make sure you discuss any questions you have with your health care provider. Document Revised: 10/22/2019 Document Reviewed: 05/24/2019 Elsevier Patient Education  2021 [place] After This sheet gives you information about how to care for yourself after your procedure. Your health care provider may also give you more specific instructions. If you have problems or questions, contact your health care provider. What can I expect after the procedure? After the procedure, it is common to have:  Tiredness.  Forgetfulness about what happened after the procedure.  Impaired judgment for important decisions.  Nausea or vomiting.  Some difficulty with balance. Follow these instructions at home: For the time period you were told by your health care provider:  Rest as needed.  Do not participate in activities where you could fall or become injured.  Do not drive or use machinery.  Do not drink alcohol.  Do not take sleeping pills or medicines that cause drowsiness.  Do not make important decisions or sign legal documents.  Do not take care of children on your own.      Eating and drinking  Follow the diet that is recommended by your health care provider.  Drink enough fluid to keep your urine pale yellow.  If you  vomit: ? Drink water, juice, or soup when you can drink without vomiting. ? Make sure you have little or no nausea before eating solid foods. General instructions  Have a responsible adult stay with you for the time you are told. It is important to have someone help care for you until you are awake and alert.  Take over-the-counter and prescription medicines only as told by your health care provider.  If you have sleep apnea, surgery and certain medicines can increase your risk for breathing problems. Follow instructions from your health care provider about wearing your sleep device: ? Anytime you are sleeping, including during daytime naps. ? While taking prescription pain medicines, sleeping medicines, or medicines that make you drowsy.  Avoid smoking.  Keep all  follow-up visits as told by your health care provider. This is important. Contact a health care provider if:  You keep feeling nauseous or you keep vomiting.  You feel light-headed.  You are still sleepy or having trouble with balance after 24 hours.  You develop a rash.  You have a fever.  You have redness or swelling around the IV site. Get help right away if:  You have trouble breathing.  You have new-onset confusion at home. Summary  For several hours after your procedure, you may feel tired. You may also be forgetful and have poor judgment.  Have a responsible adult stay with you for the time you are told. It is important to have someone help care for you until you are awake and alert.  Rest as told. Do not drive or operate machinery. Do not drink alcohol or take sleeping pills.  Get help right away if you have trouble breathing, or if you suddenly become confused. This information is not intended to replace advice given to you by your health care provider. Make sure you discuss any questions you have with your health care provider. Document Revised: 07/13/2020 Document Reviewed: 09/30/2019 Elsevier Patient  Education  2021 [employer].

## 2020-11-24 ENCOUNTER — Encounter (HOSPITAL_COMMUNITY): Payer: Self-pay

## 2020-11-24 ENCOUNTER — Other Ambulatory Visit (HOSPITAL_COMMUNITY)
Admission: RE | Admit: 2020-11-24 | Discharge: 2020-11-24 | Disposition: A | Discharge status: Home / Self Care | Payer: BC Managed Care – PPO | Source: Ambulatory Visit | Attending: Internal Medicine | Admitting: Internal Medicine

## 2020-11-24 ENCOUNTER — Other Ambulatory Visit: Payer: Self-pay

## 2020-11-24 ENCOUNTER — Encounter (HOSPITAL_COMMUNITY)
Admission: RE | Admit: 2020-11-24 | Discharge: 2020-11-24 | Disposition: A | Discharge status: Home / Self Care | Payer: BC Managed Care – PPO | Source: Ambulatory Visit | Attending: Internal Medicine | Admitting: Internal Medicine

## 2020-11-24 DIAGNOSIS — Z20822 Contact with and (suspected) exposure to covid-19: Secondary | ICD-10-CM | POA: Insufficient documentation

## 2020-11-24 DIAGNOSIS — Z01818 Encounter for other preprocedural examination: Secondary | ICD-10-CM | POA: Insufficient documentation

## 2020-11-24 LAB — BASIC METABOLIC PANEL
Anion gap: 10 (ref 5–15)
BUN: 23 mg/dL — ABNORMAL HIGH (ref 6–20)
CO2: 26 mmol/L (ref 22–32)
Calcium: 9.5 mg/dL (ref 8.9–10.3)
Chloride: 101 mmol/L (ref 98–111)
Creatinine, Ser: 1 mg/dL (ref 0.61–1.24)
GFR, Estimated: >60 mL/min (ref >60)
Glucose, Bld: 78 mg/dL (ref 70–99)
Potassium: 3.5 mmol/L (ref 3.5–5.1)
Sodium: 137 mmol/L (ref 135–145)

## 2020-11-25 LAB — SARS CORONAVIRUS 2 (TAT 6-24 HRS): SARS Coronavirus 2: NEGATIVE

## 2020-11-28 ENCOUNTER — Other Ambulatory Visit: Payer: Self-pay

## 2020-11-28 ENCOUNTER — Encounter (HOSPITAL_COMMUNITY): Payer: Self-pay | Admitting: Internal Medicine

## 2020-11-28 ENCOUNTER — Ambulatory Visit (HOSPITAL_COMMUNITY): Payer: BC Managed Care – PPO | Admitting: Anesthesiology

## 2020-11-28 ENCOUNTER — Encounter (HOSPITAL_COMMUNITY)
Admission: RE | Disposition: A | Discharge status: Home / Self Care | Payer: Self-pay | Source: Home / Self Care | Attending: Internal Medicine

## 2020-11-28 ENCOUNTER — Ambulatory Visit (HOSPITAL_COMMUNITY)
Admission: RE | Admit: 2020-11-28 | Discharge: 2020-11-28 | Disposition: A | Discharge status: Home / Self Care | Payer: BC Managed Care – PPO | Attending: Internal Medicine | Admitting: Internal Medicine

## 2020-11-28 DIAGNOSIS — K635 Polyp of colon: Secondary | ICD-10-CM | POA: Diagnosis not present

## 2020-11-28 DIAGNOSIS — F17210 Nicotine dependence, cigarettes, uncomplicated: Secondary | ICD-10-CM | POA: Diagnosis not present

## 2020-11-28 DIAGNOSIS — Z8261 Family history of arthritis: Secondary | ICD-10-CM | POA: Insufficient documentation

## 2020-11-28 DIAGNOSIS — Z79899 Other long term (current) drug therapy: Secondary | ICD-10-CM | POA: Diagnosis not present

## 2020-11-28 DIAGNOSIS — Z1211 Encounter for screening for malignant neoplasm of colon: Secondary | ICD-10-CM | POA: Diagnosis not present

## 2020-11-28 DIAGNOSIS — Z791 Long term (current) use of non-steroidal anti-inflammatories (NSAID): Secondary | ICD-10-CM | POA: Diagnosis not present

## 2020-11-28 DIAGNOSIS — D123 Benign neoplasm of transverse colon: Secondary | ICD-10-CM | POA: Insufficient documentation

## 2020-11-28 HISTORY — PX: COLONOSCOPY WITH PROPOFOL: SHX5780

## 2020-11-28 HISTORY — PX: POLYPECTOMY: SHX5525

## 2020-11-28 SURGERY — COLONOSCOPY WITH PROPOFOL
Anesthesia: General

## 2020-11-28 MED ORDER — PROPOFOL 10 MG/ML IV BOLUS
INTRAVENOUS | Status: DC | PRN
  Administered 2020-11-28: 150 ug/kg/min via INTRAVENOUS
  Administered 2020-11-28 (×2): 100 mg via INTRAVENOUS

## 2020-11-28 MED ORDER — LACTATED RINGERS IV SOLN: INTRAVENOUS | Status: DC | PRN

## 2020-11-28 MED ORDER — STERILE WATER FOR IRRIGATION IR SOLN
Status: DC | PRN
  Administered 2020-11-28: 100 mL

## 2020-11-28 MED ORDER — LACTATED RINGERS IV SOLN: INTRAVENOUS | Status: DC

## 2020-11-28 NOTE — Anesthesia Preprocedure Evaluation (Addendum)
Anesthesia Evaluation  Patient identified by MRN, date of birth, ID band Patient awake    Reviewed: Allergy & Precautions, NPO status , Patient's Chart, lab work & pertinent test results  History of Anesthesia Complications Negative for: history of anesthetic complications  Airway Mallampati: II  TM Distance: >3 FB Neck ROM: Full    Dental  (+) Dental Advisory Given   Pulmonary neg COPD,  COPD inhaler, Current Smoker and Patient abstained from smoking.,    Pulmonary exam normal breath sounds clear to auscultation       Cardiovascular Exercise Tolerance: Good hypertension, Pt. on medications Normal cardiovascular exam Rhythm:Regular Rate:Normal     Neuro/Psych negative neurological ROS  negative psych ROS   GI/Hepatic negative GI ROS, (+)     substance abuse  alcohol use,   Endo/Other  negative endocrine ROS  Renal/GU negative Renal ROS     Musculoskeletal negative musculoskeletal ROS (+)   Abdominal   Peds  Hematology negative hematology ROS (+)   Anesthesia Other Findings   Reproductive/Obstetrics negative OB ROS                             Anesthesia Physical Anesthesia Plan  ASA: II  Anesthesia Plan: General   Post-op Pain Management:    Induction: Intravenous  PONV Risk Score and Plan: TIVA  Airway Management Planned: Nasal Cannula and Natural Airway  Additional Equipment:   Intra-op Plan:   Post-operative Plan:   Informed Consent: I have reviewed the patients History and Physical, chart, labs and discussed the procedure including the risks, benefits and alternatives for the proposed anesthesia with the patient or authorized representative who has indicated his/her understanding and acceptance.     Dental advisory given  Plan Discussed with: CRNA and Surgeon  Anesthesia Plan Comments:         Anesthesia Quick Evaluation

## 2020-11-28 NOTE — Op Note (Signed)
[facility] Patient Name: Mark Guzman Procedure Date: 11/28/2020 7:44 AM MRN: [id number] Date of Birth: July 03, 1966 Attending MD: Norvel Richards , MD CSN: [id number] Age: 55 Admit Type: Outpatient Procedure:                Colonoscopy Indications:              Screening for colorectal malignant neoplasm Providers:                Norvel Richards, MD, Lambert Mody, Aram Candela Referring MD:              Medicines:                Propofol per Anesthesia Complications:            No immediate complications. Estimated Blood Loss:     Estimated blood loss was minimal. Procedure:                Pre-Anesthesia Assessment:                           - Prior to the procedure, a History and Physical                            was performed, and patient medications and                            allergies were reviewed. The patient's tolerance of                            previous anesthesia was also reviewed. The risks                            and benefits of the procedure and the sedation                            options and risks were discussed with the patient.                            All questions were answered, and informed consent                            was obtained. Prior Anticoagulants: The patient has                            taken no previous anticoagulant or antiplatelet                            agents. ASA Grade Assessment: III - A patient with                            severe systemic disease. After reviewing the risks  and benefits, the patient was deemed in                            satisfactory condition to undergo the procedure.                           After obtaining informed consent, the colonoscope                            was passed under direct vision. Throughout the                            procedure, the patient's blood pressure, pulse, and                            oxygen  saturations were monitored continuously. The                            [id number] scope was introduced through                            the anus and advanced to the the cecum, identified                            by appendiceal orifice and ileocecal valve. The                            colonoscopy was performed without difficulty. The                            patient tolerated the procedure well. The quality                            of the bowel preparation was adequate. Scope In: 8:10:01 AM Scope Out: 8:21:54 AM Scope Withdrawal Time: 0 hours 10 minutes 1 second  Total Procedure Duration: 0 hours 11 minutes 53 seconds  Findings:      The perianal and digital rectal examinations were normal.      A 5 mm polyp was found in the transverse colon. The polyp was sessile.       The polyp was removed with a cold snare. Resection and retrieval were       complete. Estimated blood loss was minimal.      The exam was otherwise without abnormality on direct and retroflexion       views. Impression:               - One 5 mm polyp in the transverse colon, removed                            with a cold snare. Resected and retrieved.                           - The examination was otherwise normal on direct  and retroflexion views. Moderate Sedation:      Moderate (conscious) sedation was personally administered by an       anesthesia professional. The following parameters were monitored: oxygen       saturation, heart rate, blood pressure, respiratory rate, EKG, adequacy       of pulmonary ventilation, and response to care. Recommendation:           - Patient has a contact number available for                            emergencies. The signs and symptoms of potential                            delayed complications were discussed with the                            patient. Return to normal activities tomorrow.                            Written discharge  instructions were provided to the                            patient.                           - Advance diet as tolerated.                           - Continue present medications.                           - Repeat colonoscopy date to be determined after                            pending pathology results are reviewed for                            surveillance.                           - Return to GI office (date not yet determined). Procedure Code(s):        --- Professional ---                           [phone], Colonoscopy, flexible; with removal of                            tumor(s), polyp(s), or other lesion(s) by snare                            technique Diagnosis Code(s):        --- Professional ---                           Z12.11, Encounter for screening for malignant  neoplasm of colon                           K63.5, Polyp of colon CPT copyright 2019 American Medical Association. All rights reserved. The codes documented in this report are preliminary and upon coder review may  be revised to meet current compliance requirements. Cristopher Estimable. Jadalyn Oliveri, MD Norvel Richards, MD 11/28/2020 8:33:16 AM This report has been signed electronically. Number of Addenda: 0

## 2020-11-28 NOTE — Anesthesia Postprocedure Evaluation (Signed)
Anesthesia Post Note  Patient: Mark Guzman  Procedure(s) Performed: COLONOSCOPY WITH PROPOFOL (N/A ) POLYPECTOMY  Patient location during evaluation: PACU Anesthesia Type: General Level of consciousness: awake, oriented, awake and alert and patient cooperative Pain management: pain level controlled Vital Signs Assessment: post-procedure vital signs reviewed and stable Respiratory status: spontaneous breathing, respiratory function stable and nonlabored ventilation Cardiovascular status: blood pressure returned to baseline and stable Postop Assessment: no headache and no backache Anesthetic complications: no   No complications documented.   Last Vitals:  Vitals:   11/28/20 0708  BP: (!) 128/95  Pulse: 83  Resp: 15  Temp: 36.7 C  SpO2: 97%    Last Pain:  Vitals:   11/28/20 0806  TempSrc:   PainSc: 0-No pain                 Tacy Learn

## 2020-11-28 NOTE — Discharge Instructions (Signed)
  Colonoscopy Discharge Instructions  Read the instructions outlined below and refer to this sheet in the next few weeks. These discharge instructions provide you with general information on caring for yourself after you leave the hospital. Your doctor may also give you specific instructions. While your treatment has been planned according to the most current medical practices available, unavoidable complications occasionally occur. If you have any problems or questions after discharge, call Dr. Gala Romney at [phone]. ACTIVITY  You may resume your regular activity, but move at a slower pace for the next 24 hours.   Take frequent rest periods for the next 24 hours.   Walking will help get rid of the air and reduce the bloated feeling in your belly (abdomen).   No driving for 24 hours (because of the medicine (anesthesia) used during the test).    Do not sign any important legal documents or operate any machinery for 24 hours (because of the anesthesia used during the test).  NUTRITION  Drink plenty of fluids.   You may resume your normal diet as instructed by your doctor.   Begin with a light meal and progress to your normal diet.   Avoid alcoholic beverages for 24 hours or as instructed.  MEDICATIONS  You may resume your normal medications unless your doctor tells you otherwise.  WHAT YOU CAN EXPECT TODAY  Some feelings of bloating in the abdomen.   Passage of more gas than usual.   Spotting of blood in your stool or on the toilet paper.  IF YOU HAD POLYPS REMOVED DURING THE COLONOSCOPY:  No aspirin products for 7 days or as instructed.   No alcohol for 7 days or as instructed.   Eat a soft diet for the next 24 hours.  FINDING OUT THE RESULTS OF YOUR TEST Not all test results are available during your visit. If your test results are not back during the visit, make an appointment with your caregiver to find out the results. Do not assume everything is normal if you have not heard  from your caregiver or the medical facility. It is important for you to follow up on all of your test results.  SEEK IMMEDIATE MEDICAL ATTENTION IF:  You have more than a spotting of blood in your stool.   Your belly is swollen (abdominal distention).   You are nauseated or vomiting.   You have a temperature over 101.   You have abdominal pain or discomfort that is severe or gets worse throughout the day.    1 polyp removed from your colon today  Further recommendations to follow pending review of pathology report  At patient request, I called Freda Munro at [phone] -left message on answering service with information

## 2020-11-28 NOTE — H&P (Signed)
@LOGO @   Primary Care Physician:  Susy Frizzle, MD Primary Gastroenterologist:  Dr. Gala Romney  Pre-Procedure History & Physical: HPI:  Mark Guzman is a 55 y.o. male is here for a screening colonoscopy.  First ever average risk screening examination.  No bowel symptoms.  No family history of colon cancer.  Past Medical History:  Diagnosis Date  . Hypertension     Past Surgical History:  Procedure Laterality Date  . ANKLE SURGERY Left   . HERNIA REPAIR     55 years old  . nose repair     traumatic injury  . ROTATOR CUFF REPAIR Right 2009   Dr. Daylene Katayama    Prior to Admission medications   Medication Sig Start Date End Date Taking? Authorizing Provider  albuterol (VENTOLIN HFA) 108 (90 Base) MCG/ACT inhaler INHALE 2 PUFFS INTO THE LUNGS EVERY 4 HOURS AS NEEDED FOR WHEEZING/SHORTNESS OF BREATH/COUGHING Patient taking differently: Inhale 2 puffs into the lungs every 4 (four) hours as needed for wheezing or shortness of breath. 05/17/20  Yes Susy Frizzle, MD  ALPRAZolam Duanne Moron) 0.5 MG tablet TAKE 1 TABLET BY MOUTH EVERY EVENING AT BEDTIME AS NEEDED FOR ANXIETY Patient taking differently: Take 0.5 mg by mouth at bedtime as needed for sleep. 10/19/20  Yes Susy Frizzle, MD  ibuprofen (ADVIL) 200 MG tablet Take 400 mg by mouth every 6 (six) hours as needed for mild pain.   Yes [provider]  loratadine (CLARITIN) 10 MG tablet TAKE 1 TABLET BY MOUTH EVERY DAY Patient taking differently: Take 10 mg by mouth daily as needed for allergies. 09/05/20  Yes Ursina, Modena Nunnery, MD  losartan-hydrochlorothiazide (HYZAAR) 100-25 MG tablet TAKE 1 TABLET BY MOUTH EVERY DAY Patient taking differently: Take 0.5 tablets by mouth daily. 09/22/20  Yes Susy Frizzle, MD  multivitamin-iron-minerals-folic acid (CENTRUM) chewable tablet Chew 1 tablet by mouth daily.   Yes [provider]    Allergies as of 09/20/2020  . (No Known Allergies)    Family History  Problem  Relation Age of Onset  . Arthritis Father   . Colon cancer Neg Hx     Social History   Socioeconomic History  . Marital status: Married    Spouse name: Not on file  . Number of children: Not on file  . Years of education: Not on file  . Highest education level: Not on file  Occupational History  . Not on file  Tobacco Use  . Smoking status: Current Every Day Smoker    Packs/day: 0.25    Types: Cigarettes  . Smokeless tobacco: Never Used  Vaping Use  . Vaping Use: Every day  Substance and Sexual Activity  . Alcohol use: Yes    Comment: 4-6 beer daily-last use 2 days ago  . Drug use: No  . Sexual activity: Yes  Other Topics Concern  . Not on file  Social History Narrative  . Not on file   Social Determinants of Health   Financial Resource Strain: Not on file  Food Insecurity: Not on file  Transportation Needs: Not on file  Physical Activity: Not on file  Stress: Not on file  Social Connections: Not on file  Intimate Partner Violence: Not on file    Review of Systems: See HPI, otherwise negative ROS  Physical Exam: BP (!) 128/95   Pulse 83   Temp 98.1 F (36.7 C) (Oral)   Resp 15   SpO2 97%  General:   Alert,  Well-developed, well-nourished,  pleasant and cooperative in NAD Head:  Normocephalic and atraumatic. Lungs:  Clear throughout to auscultation.   No wheezes, crackles, or rhonchi. No acute distress. Heart:  Regular rate and rhythm; no murmurs, clicks, rubs,  or gallops. Abdomen:  Soft, nontender and nondistended. No masses, hepatosplenomegaly or hernias noted. Normal bowel sounds, without guarding, and without rebound.    Impression/Plan: [name] is now here to undergo a screening colonoscopy.  First ever average risk screening examination  Risks, benefits, limitations, imponderables and alternatives regarding colonoscopy have been reviewed with the patient. Questions have been answered. All parties agreeable.     Notice:  This dictation was  prepared with Dragon dictation along with smaller phrase technology. Any transcriptional errors that result from this process are unintentional and may not be corrected upon review.

## 2020-11-28 NOTE — Transfer of Care (Signed)
Immediate Anesthesia Transfer of Care Note  Patient: Mark Guzman  Procedure(s) Performed: COLONOSCOPY WITH PROPOFOL (N/A ) POLYPECTOMY  Patient Location: PACU  Anesthesia Type:General  Level of Consciousness: awake, alert , oriented and patient cooperative  Airway & Oxygen Therapy: Patient Spontanous Breathing  Post-op Assessment: Report given to RN, Post -op Vital signs reviewed and stable and Patient moving all extremities  Post vital signs: Reviewed and stable  Last Vitals:  Vitals Value Taken Time  BP    Temp    Pulse    Resp    SpO2      Last Pain:  Vitals:   11/28/20 0806  TempSrc:   PainSc: 0-No pain         Complications: No complications documented.

## 2020-11-29 ENCOUNTER — Encounter: Payer: Self-pay | Admitting: Internal Medicine

## 2020-11-29 LAB — SURGICAL PATHOLOGY

## 2020-12-04 ENCOUNTER — Encounter (HOSPITAL_COMMUNITY): Payer: Self-pay | Admitting: Internal Medicine

## 2020-12-19 ENCOUNTER — Other Ambulatory Visit: Payer: Self-pay | Admitting: Family Medicine

## 2020-12-19 DIAGNOSIS — F5101 Primary insomnia: Secondary | ICD-10-CM

## 2020-12-27 ENCOUNTER — Other Ambulatory Visit: Payer: Self-pay | Admitting: Family Medicine

## 2021-02-19 ENCOUNTER — Other Ambulatory Visit: Payer: Self-pay | Admitting: Family Medicine

## 2021-02-19 DIAGNOSIS — F5101 Primary insomnia: Secondary | ICD-10-CM

## 2021-02-19 NOTE — Telephone Encounter (Signed)
Ok to refill??  Last office visit 08/18/2020.  Last refill 12/19/2020.

## 2021-03-09 ENCOUNTER — Other Ambulatory Visit: Payer: Self-pay | Admitting: Family Medicine

## 2021-04-04 ENCOUNTER — Telehealth: Payer: Self-pay | Admitting: Family Medicine

## 2021-04-04 DIAGNOSIS — M67449 Ganglion, unspecified hand: Secondary | ICD-10-CM

## 2021-04-04 NOTE — Telephone Encounter (Signed)
Pt's spouse called in stating that he would like to get a referral to a hand surgeon preferably one in [place].    Please call: [phone]

## 2021-04-04 NOTE — Telephone Encounter (Signed)
Call placed to patient wife for more information.    Patient was seen in October 2021 for mucosal cyst to finger. At that time the plan was as follows: He has a lesion growing on the dorsal surface of his right index finger.  It is just distal to the DIP joint.  Is a clear mucus filled cyst.  It is not painful.  It is about 4 mm to 5 mm in diameter.  It clearly appears to be a digital mucous cyst.  I recommended a referral to a hand specialist to remove the digital mucous cyst.  Patient elects just to monitor it for the present time.      Patient is now requesting referral for removal of cyst. Referral orders placed.

## 2021-04-11 ENCOUNTER — Ambulatory Visit: Payer: BC Managed Care – PPO | Admitting: Orthopaedic Surgery

## 2021-04-12 ENCOUNTER — Emergency Department (HOSPITAL_COMMUNITY)
Admission: EM | Admit: 2021-04-12 | Discharge: 2021-04-12 | Disposition: A | Discharge status: Home / Self Care | Payer: BC Managed Care – PPO | Attending: Emergency Medicine | Admitting: Emergency Medicine

## 2021-04-12 ENCOUNTER — Emergency Department (HOSPITAL_COMMUNITY): Payer: BC Managed Care – PPO

## 2021-04-12 ENCOUNTER — Encounter (HOSPITAL_COMMUNITY): Payer: Self-pay | Admitting: *Deleted

## 2021-04-12 ENCOUNTER — Other Ambulatory Visit: Payer: Self-pay

## 2021-04-12 DIAGNOSIS — Z20822 Contact with and (suspected) exposure to covid-19: Secondary | ICD-10-CM | POA: Diagnosis not present

## 2021-04-12 DIAGNOSIS — J410 Simple chronic bronchitis: Secondary | ICD-10-CM | POA: Diagnosis not present

## 2021-04-12 DIAGNOSIS — R059 Cough, unspecified: Secondary | ICD-10-CM | POA: Diagnosis not present

## 2021-04-12 DIAGNOSIS — J441 Chronic obstructive pulmonary disease with (acute) exacerbation: Secondary | ICD-10-CM | POA: Diagnosis not present

## 2021-04-12 DIAGNOSIS — I10 Essential (primary) hypertension: Secondary | ICD-10-CM | POA: Insufficient documentation

## 2021-04-12 DIAGNOSIS — Z79899 Other long term (current) drug therapy: Secondary | ICD-10-CM | POA: Insufficient documentation

## 2021-04-12 DIAGNOSIS — F17210 Nicotine dependence, cigarettes, uncomplicated: Secondary | ICD-10-CM | POA: Insufficient documentation

## 2021-04-12 DIAGNOSIS — R000 Tachycardia, unspecified: Secondary | ICD-10-CM | POA: Insufficient documentation

## 2021-04-12 DIAGNOSIS — R0602 Shortness of breath: Secondary | ICD-10-CM | POA: Diagnosis not present

## 2021-04-12 DIAGNOSIS — R079 Chest pain, unspecified: Secondary | ICD-10-CM | POA: Diagnosis not present

## 2021-04-12 DIAGNOSIS — E860 Dehydration: Secondary | ICD-10-CM | POA: Insufficient documentation

## 2021-04-12 LAB — BASIC METABOLIC PANEL
Anion gap: 9 (ref 5–15)
BUN: 32 mg/dL — ABNORMAL HIGH (ref 6–20)
CO2: 27 mmol/L (ref 22–32)
Calcium: 10.4 mg/dL — ABNORMAL HIGH (ref 8.9–10.3)
Chloride: 102 mmol/L (ref 98–111)
Creatinine, Ser: 1.7 mg/dL — ABNORMAL HIGH (ref 0.61–1.24)
GFR, Estimated: 47 mL/min — ABNORMAL LOW (ref >60)
Glucose, Bld: 94 mg/dL (ref 70–99)
Potassium: 4.1 mmol/L (ref 3.5–5.1)
Sodium: 138 mmol/L (ref 135–145)

## 2021-04-12 LAB — TROPONIN I (HIGH SENSITIVITY)
Troponin I (High Sensitivity): 4 ng/L (ref <18)
Troponin I (High Sensitivity): 5 ng/L (ref <18)

## 2021-04-12 LAB — CBC
HCT: 46 % (ref 39.0–52.0)
Hemoglobin: 16.2 g/dL (ref 13.0–17.0)
MCH: 33.8 pg (ref 26.0–34.0)
MCHC: 35.2 g/dL (ref 30.0–36.0)
MCV: 96 fL (ref 80.0–100.0)
Platelets: 248 10*3/uL (ref 150–400)
RBC: 4.79 MIL/uL (ref 4.22–5.81)
RDW: 12.1 % (ref 11.5–15.5)
WBC: 10.1 10*3/uL (ref 4.0–10.5)
nRBC: 0 % (ref 0.0–0.2)

## 2021-04-12 LAB — RESP PANEL BY RT-PCR (FLU A&B, COVID) ARPGX2
Influenza A by PCR: NEGATIVE
Influenza B by PCR: NEGATIVE
SARS Coronavirus 2 by RT PCR: NEGATIVE

## 2021-04-12 MED ORDER — SODIUM CHLORIDE 0.9 % IV BOLUS
1000.0000 mL | Freq: Once | INTRAVENOUS | Status: AC
  Administered 2021-04-12: 1000 mL via INTRAVENOUS

## 2021-04-12 MED ORDER — PREDNISONE 10 MG PO TABS: 20.0000 mg | ORAL_TABLET | Freq: Every day | ORAL | 0 refills | Status: DC

## 2021-04-12 NOTE — Discharge Instructions (Signed)
Follow-up with your doctor as planned tomorrow and use your inhaler only 4 times a day

## 2021-04-12 NOTE — ED Provider Notes (Signed)
[facility] EMERGENCY DEPARTMENT Provider Note   CSN: [id number] Arrival date & time: 04/12/21  1436     History No chief complaint on file.   Mark Guzman is a 55 y.o. male.  Patient has a cough and weakness.  No fevers no chills no vomiting  The history is provided by the patient and medical records. No language interpreter was used.  Cough Cough characteristics:  Non-productive Sputum characteristics:  Nondescript Severity:  Mild Onset quality:  Gradual Timing:  Constant Progression:  Waxing and waning Chronicity:  New Smoker: no   Context: not animal exposure   Associated symptoms: no chest pain, no eye discharge, no headaches and no rash        Past Medical History:  Diagnosis Date  . Hypertension     Patient Active Problem List   Diagnosis Date Noted  . Encounter for colorectal cancer screening 09/12/2020  . Insomnia 09/25/2015    Past Surgical History:  Procedure Laterality Date  . ANKLE SURGERY Left   . COLONOSCOPY WITH PROPOFOL N/A 11/28/2020   Procedure: COLONOSCOPY WITH PROPOFOL;  Surgeon: Daneil Dolin, MD;  Location: [facility] SUITE;  Service: Endoscopy;  Laterality: N/A;  8:00am  . HERNIA REPAIR     55 years old  . nose repair     traumatic injury  . POLYPECTOMY  11/28/2020   Procedure: POLYPECTOMY;  Surgeon: Daneil Dolin, MD;  Location: [facility] END[facility] SUITE;  Service: Endoscopy;;  . ROTATOR CUFF REPAIR Right 2009   Dr. Daylene Katayama       Family History  Problem Relation Age of Onset  . Arthritis Father   . Colon cancer Neg Hx     Social History   Tobacco Use  . Smoking status: Current Every Day Smoker    Packs/day: 0.25    Types: Cigarettes  . Smokeless tobacco: Never Used  Vaping Use  . Vaping Use: Every day  Substance Use Topics  . Alcohol use: Yes    Comment: 4-6 beer daily-last use 2 days ago  . Drug use: No    Home Medications Prior to Admission medications   Medication Sig Start Date End Date Taking? Authorizing Provider   albuterol (VENTOLIN HFA) 108 (90 Base) MCG/ACT inhaler INHALE 2 PUFFS INTO THE LUNGS EVERY 4 HOURS AS NEEDED FOR WHEEZING/SHORTNESS OF BREATH/COUGH 03/09/21   Susy Frizzle, MD  ALPRAZolam Duanne Moron) 0.5 MG tablet TAKE 1 TABLET BY MOUTH EVERY EVENING AT BEDTIME AS NEEDED FOR ANXIETY 02/19/21   Susy Frizzle, MD  ibuprofen (ADVIL) 200 MG tablet Take 400 mg by mouth every 6 (six) hours as needed for mild pain.    [provider]  loratadine (CLARITIN) 10 MG tablet TAKE 1 TABLET BY MOUTH EVERY DAY Patient taking differently: Take 10 mg by mouth daily as needed for allergies. 09/05/20   Brownwood, Modena Nunnery, MD  losartan-hydrochlorothiazide (HYZAAR) 100-25 MG tablet TAKE 1 TABLET BY MOUTH EVERY DAY Patient taking differently: Take 0.5 tablets by mouth daily. 09/22/20   Susy Frizzle, MD  multivitamin-iron-minerals-folic acid (CENTRUM) chewable tablet Chew 1 tablet by mouth daily.    [provider]    Allergies    Patient has no known allergies.  Review of Systems   Review of Systems  Constitutional: Negative for appetite change and fatigue.  HENT: Negative for congestion, ear discharge and sinus pressure.   Eyes: Negative for discharge.  Respiratory: Positive for cough.   Cardiovascular: Negative for chest pain.  Gastrointestinal: Negative for  abdominal pain and diarrhea.  Genitourinary: Negative for frequency and hematuria.  Musculoskeletal: Negative for back pain.  Skin: Negative for rash.  Neurological: Negative for seizures and headaches.  Psychiatric/Behavioral: Negative for hallucinations.    Physical Exam Updated Vital Signs BP 117/85 (BP Location: Right Arm)   Pulse (!) 101   Temp 98.3 F (36.8 C) (Oral)   Resp 20   Ht 5\' 6"  (1.676 m)   Wt 90.7 kg   SpO2 100%   BMI 32.28 kg/m   Physical Exam Vitals and nursing note reviewed.  Constitutional:      Appearance: He is well-developed.  HENT:     Head: Normocephalic.     Nose: Nose normal.  Eyes:      General: No scleral icterus.    Conjunctiva/sclera: Conjunctivae normal.  Neck:     Thyroid: No thyromegaly.  Cardiovascular:     Rate and Rhythm: Normal rate and regular rhythm.     Heart sounds: No murmur heard. No friction rub. No gallop.   Pulmonary:     Breath sounds: No stridor. No wheezing or rales.  Chest:     Chest wall: No tenderness.  Abdominal:     General: There is no distension.     Tenderness: There is no abdominal tenderness. There is no rebound.  Musculoskeletal:        General: Normal range of motion.     Cervical back: Neck supple.  Lymphadenopathy:     Cervical: No cervical adenopathy.  Skin:    Findings: No erythema or rash.  Neurological:     Mental Status: He is alert and oriented to person, place, and time.     Motor: No abnormal muscle tone.     Coordination: Coordination normal.  Psychiatric:        Behavior: Behavior normal.     ED Results / Procedures / Treatments   Labs (all labs ordered are listed, but only abnormal results are displayed) Labs Reviewed  BASIC METABOLIC PANEL - Abnormal; Notable for the following components:      Result Value   BUN 32 (*)    Creatinine, Ser 1.70 (*)    Calcium 10.4 (*)    GFR, Estimated 47 (*)    All other components within normal limits  CBC  TROPONIN I (HIGH SENSITIVITY)  TROPONIN I (HIGH SENSITIVITY)    EKG EKG Interpretation  Date/Time:  Thursday April 12 2021 14:46:32 EDT Ventricular Rate:  102 PR Interval:  152 QRS Duration: 78 QT Interval:  326 QTC Calculation: 424 R Axis:   75 Text Interpretation: Sinus tachycardia Otherwise normal ECG Confirmed by Nanda Quinton [phone]) on 04/12/2021 2:50:08 PM   Radiology DG Chest 2 View  Result Date: 04/12/2021 CLINICAL DATA:  Chest pain and shortness of breath EXAM: CHEST - 2 VIEW COMPARISON:  November 13, 2015 FINDINGS: Lungs are clear. Heart size and pulmonary vascularity are normal. No adenopathy. No bone lesions. No pneumothorax. IMPRESSION:  Lungs clear.  Cardiac silhouette normal. Electronically Signed   By: Lowella Grip III M.D.   On: 04/12/2021 15:02    Procedures Procedures   Medications Ordered in ED Medications  sodium chloride 0.9 % bolus 1,000 mL (has no administration in time range)    ED Course  I have reviewed the triage vital signs and the nursing notes.  Pertinent labs & imaging results that were available during my care of the patient were reviewed by me and considered in my medical decision making (see chart for  details).    MDM Rules/Calculators/A&P                         Pt with copd exacerbation and dehydration.  Pt improved with fluids and is discharged with prednisone.  He will continue using inhaler 4 times a day if needed.  And follow-up with his PCP Final Clinical Impression(s) / ED Diagnoses Final diagnoses:  None    Rx / DC Orders ED Discharge Orders    None       Milton Ferguson, MD 04/16/21 1041

## 2021-04-12 NOTE — ED Notes (Addendum)
Visitor in room, comes out of room and ask nurse to see pt due to pt having some SOB.  RA sats 100%, no distress noted. States pt has been getting nasal congestion and dry cough at night for past several months.  Asked EDP to assess pt as well.

## 2021-04-12 NOTE — ED Notes (Signed)
Urinal provided for pt.

## 2021-04-12 NOTE — ED Notes (Signed)
ED Provider at bedside. 

## 2021-04-12 NOTE — ED Triage Notes (Signed)
Crying in triage, states he has had problems for 6 months, denies pain at present

## 2021-04-12 NOTE — ED Notes (Signed)
Pt states he quit smoking about 7 days ago.

## 2021-04-13 ENCOUNTER — Encounter: Payer: Self-pay | Admitting: Nurse Practitioner

## 2021-04-13 ENCOUNTER — Telehealth (INDEPENDENT_AMBULATORY_CARE_PROVIDER_SITE_OTHER): Payer: BC Managed Care – PPO | Admitting: Nurse Practitioner

## 2021-04-13 ENCOUNTER — Other Ambulatory Visit: Payer: Self-pay

## 2021-04-13 DIAGNOSIS — R059 Cough, unspecified: Secondary | ICD-10-CM | POA: Diagnosis not present

## 2021-04-13 DIAGNOSIS — Z122 Encounter for screening for malignant neoplasm of respiratory organs: Secondary | ICD-10-CM

## 2021-04-13 DIAGNOSIS — Z87891 Personal history of nicotine dependence: Secondary | ICD-10-CM | POA: Diagnosis not present

## 2021-04-13 NOTE — Progress Notes (Signed)
Subjective:    Patient ID: Mark Guzman, male    DOB: 12-12-1965, 55 y.o.   MRN: [id number]  HPI: Mark Guzman is a 56 y.o. male presenting virtually for cough.  Chief Complaint  Patient presents with  . Cough   COUGH Reports BP dropped yesterday 94/67 and HR 119 and went to ER.  Chest x-ray was done and patient was given prednisone which he is starting today.  Using albuterol inhaler 1 times to 3 times daily. Duration: months Circumstances of initial development of cough: "around time pollen hit"  Cough description: dry Aggravating factors: laying down at night, going outside, physical activity Alleviating factors: inhaler, moving around Status: stable Treatments attempted:  Wheezing: yes; worse at night Shortness of breath: yes Chest pain: no Chest tightness:no Nasal congestion: yes Runny nose: yes  Postnasal drip: no Frequent throat clearing or swallowing: yes Hemoptysis: no Fevers: no Night sweats: no Weight loss: no Heartburn: no Recent foreign travel: no Tuberculosis contacts: no  No Known Allergies  Outpatient Encounter Medications as of 04/13/2021  Medication Sig  . albuterol (VENTOLIN HFA) 108 (90 Base) MCG/ACT inhaler INHALE 2 PUFFS INTO THE LUNGS EVERY 4 HOURS AS NEEDED FOR WHEEZING/SHORTNESS OF BREATH/COUGH  . ALPRAZolam (XANAX) 0.5 MG tablet TAKE 1 TABLET BY MOUTH EVERY EVENING AT BEDTIME AS NEEDED FOR ANXIETY  . ibuprofen (ADVIL) 200 MG tablet Take 400 mg by mouth every 6 (six) hours as needed for mild pain.  Marland Kitchen loratadine (CLARITIN) 10 MG tablet TAKE 1 TABLET BY MOUTH EVERY DAY (Patient taking differently: Take 10 mg by mouth daily as needed for allergies.)  . losartan-hydrochlorothiazide (HYZAAR) 100-25 MG tablet TAKE 1 TABLET BY MOUTH EVERY DAY (Patient taking differently: Take 0.5 tablets by mouth daily.)  . multivitamin-iron-minerals-folic acid (CENTRUM) chewable tablet Chew 1 tablet by mouth daily.  . predniSONE (DELTASONE) 10 MG tablet Take 2  tablets (20 mg total) by mouth daily.   No facility-administered encounter medications on file as of 04/13/2021.    Patient Active Problem List   Diagnosis Date Noted  . Encounter for colorectal cancer screening 09/12/2020  . Insomnia 09/25/2015    Past Medical History:  Diagnosis Date  . Hypertension     Relevant past medical, surgical, family and social history reviewed and updated as indicated. Interim medical history since our last visit reviewed.  Review of Systems Per HPI unless specifically indicated above     Objective:    There were no vitals taken for this visit.  Wt Readings from Last 3 Encounters:  04/12/21 200 lb (90.7 kg)  11/24/20 200 lb (90.7 kg)  09/12/20 202 lb 6.4 oz (91.8 kg)    Physical Exam Physical examination unable to be performed due to lack of equipment.  Patient talking in complete sentences during telemedicine visit.  Results for orders placed or performed during the hospital encounter of 04/12/21  Resp Panel by RT-PCR (Flu A&B, Covid) Nasopharyngeal Swab   Specimen: Nasopharyngeal Swab; Nasopharyngeal(NP) swabs in vial transport medium  Result Value Ref Range   SARS Coronavirus 2 by RT PCR NEGATIVE NEGATIVE   Influenza A by PCR NEGATIVE NEGATIVE   Influenza B by PCR NEGATIVE NEGATIVE  Basic metabolic panel  Result Value Ref Range   Sodium 138 135 - 145 mmol/L   Potassium 4.1 3.5 - 5.1 mmol/L   Chloride 102 98 - 111 mmol/L   CO2 27 22 - 32 mmol/L   Glucose, Bld 94 70 - 99 mg/dL   BUN  32 (H) 6 - 20 mg/dL   Creatinine, Ser 1.70 (H) 0.61 - 1.24 mg/dL   Calcium 10.4 (H) 8.9 - 10.3 mg/dL   GFR, Estimated 47 (L) >60 mL/min   Anion gap 9 5 - 15  CBC  Result Value Ref Range   WBC 10.1 4.0 - 10.5 K/uL   RBC 4.79 4.22 - 5.81 MIL/uL   Hemoglobin 16.2 13.0 - 17.0 g/dL   HCT 46.0 39.0 - 52.0 %   MCV 96.0 80.0 - 100.0 fL   MCH 33.8 26.0 - 34.0 pg   MCHC 35.2 30.0 - 36.0 g/dL   RDW 12.1 11.5 - 15.5 %   Platelets 248 150 - 400 K/uL   nRBC 0.0  0.0 - 0.2 %  Troponin I (High Sensitivity)  Result Value Ref Range   Troponin I (High Sensitivity) 4 <18 ng/L  Troponin I (High Sensitivity)  Result Value Ref Range   Troponin I (High Sensitivity) 5 <18 ng/L      Assessment & Plan:  1. Cough Acute.  In the emergency room, his vital signs were stable he tested negative for COVID-19, and the flu.  Differential diagnoses include asthma, COPD, postviral cough, allergies, acid reflux.  Encouraged patient to continue using rescue inhaler as needed.  Complete the full course of prednisone as prescribed by emergency room physician.  Follow-up in 1 to 2 weeks in primary care office to see how her breathing is doing.  In the meantime, if breathing worsens, contact our clinic or seek urgent care.  2. Encounter for screening for malignant neoplasm of lung in former smoker who quit in past 15 years with 30 pack year history or greater Patient reports he quit smoking 7 days ago.  He smoked at least 1 pack/day for the last 40 years.  Discussed lung cancer screening with low-dose CAT scan-patient is agreeable.  Will place order today.  - CT CHEST LUNG CA SCREEN LOW DOSE W/O CM; Future    Follow up plan: Return for 1-2 weeks coughing f/u.  This visit was completed via telephone due to the restrictions of the COVID-19 pandemic. All issues as above were discussed and addressed but no physical exam was performed. If it was felt that the patient should be evaluated in the office, they were directed there. The patient verbally consented to this visit. Patient was unable to complete an audio/visual visit due to Technical difficulties. . Location of the patient: home . Location of the provider: work . Those involved with this call:  . Provider: Noemi Chapel, DNP, FNP-C . CMA: n/a . Front Desk/Registration: Santina Evans  . Time spent on call: 11 minutes on the phone discussing health concerns. 20 minutes total spent in review of patient's record and  preparation of their chart.  I verified patient identity using two factors (patient name and date of birth). Patient consents verbally to being seen via telemedicine visit today.

## 2021-04-17 ENCOUNTER — Ambulatory Visit: Payer: BC Managed Care – PPO | Admitting: Orthopaedic Surgery

## 2021-04-17 NOTE — Progress Notes (Signed)
Patient declined follow up appt; stated he doesn't think he needs it since medication has helped him tremendously.

## 2021-04-24 ENCOUNTER — Other Ambulatory Visit: Payer: Self-pay | Admitting: Family Medicine

## 2021-04-24 DIAGNOSIS — F5101 Primary insomnia: Secondary | ICD-10-CM

## 2021-04-25 ENCOUNTER — Other Ambulatory Visit: Payer: Self-pay | Admitting: Family Medicine

## 2021-04-25 NOTE — Telephone Encounter (Signed)
Ok to refill??  Last office visit 04/13/2021.  Last refill 02/19/2021.

## 2021-04-26 ENCOUNTER — Ambulatory Visit (INDEPENDENT_AMBULATORY_CARE_PROVIDER_SITE_OTHER): Payer: BC Managed Care – PPO | Admitting: Nurse Practitioner

## 2021-04-26 ENCOUNTER — Other Ambulatory Visit: Payer: Self-pay

## 2021-04-26 ENCOUNTER — Encounter: Payer: Self-pay | Admitting: Nurse Practitioner

## 2021-04-26 VITALS — BP 138/96 | HR 87 | Temp 98.7°F | Ht 66.0 in | Wt 180.4 lb

## 2021-04-26 DIAGNOSIS — R0981 Nasal congestion: Secondary | ICD-10-CM

## 2021-04-26 DIAGNOSIS — R062 Wheezing: Secondary | ICD-10-CM

## 2021-04-26 MED ORDER — IPRATROPIUM-ALBUTEROL 0.5-2.5 (3) MG/3ML IN SOLN: 3.0000 mL | Freq: Once | RESPIRATORY_TRACT | Status: DC

## 2021-04-26 NOTE — Progress Notes (Signed)
Subjective:    Patient ID: Mark Guzman, male    DOB: September 18, 1966, 55 y.o.   MRN: [id number]  HPI: Mark Guzman is a 55 y.o. male presenting for ongoing congestion.  Chief Complaint  Patient presents with   Sinus Problem    Given pred at ER told he had bronchitis, still having congestion, some sob using inhaler for sob. No longer coughing due to prednisone   UPPER RESPIRATORY TRACT INFECTION Patient reports ongoing nasal congestion.  Started taking nasal decongestant daily and has been taking for the past week.  Taking saline nasal spray as well.  Took flonase in the past and did not see much of a difference.    Also reports shortness of breath, wheezing, and cough when he lays down at night.  Takes albuterol inhaler every night as a result.  Quit smoking 3 weeks ago. Onset: 2 weeks ago COVID-19 testing history: tested negative in urgent care Fever: no Cough: yes; dry cough at night time Shortness of breath: yes Wheezing: yes Chest pain: no Chest tightness: yes Chest congestion: no Nasal congestion: yes Runny nose: no Post nasal drip: no Sneezing: no Sore throat: no Swollen glands: no Sinus pressure: no Headache: no Face pain: no Toothache: no Ear pain: no  Ear pressure: no  Eyes red/itching:no Eye drainage/crusting: no  Nausea: no  Vomiting: no Diarrhea: no  Change in appetite: no  Loss of taste/smell: no  Rash: no Fatigue:  yes; has  not been sleeping well at night Sick contacts: no Strep contacts: no  Context: stable Recurrent sinusitis: no Treatments attempted: prednisone, albuterol, claritin, A Relief with OTC medications: some  Of note, also reports he had nose reconstruction when he was a child.  No Known Allergies  Outpatient Encounter Medications as of 04/26/2021  Medication Sig   albuterol (VENTOLIN HFA) 108 (90 Base) MCG/ACT inhaler INHALE 2 PUFFS INTO THE LUNGS EVERY 4 HOURS AS NEEDED FOR WHEEZING/SHORTNESS OF BREATH/COUGH   ALPRAZolam  (XANAX) 0.5 MG tablet TAKE 1 TABLET BY MOUTH EVERY EVENING AT BEDTIME AS NEEDED FOR ANXIETY   ibuprofen (ADVIL) 200 MG tablet Take 400 mg by mouth every 6 (six) hours as needed for mild pain.   loratadine (CLARITIN) 10 MG tablet TAKE 1 TABLET BY MOUTH EVERY DAY (Patient taking differently: Take 10 mg by mouth daily as needed for allergies.)   losartan-hydrochlorothiazide (HYZAAR) 100-25 MG tablet TAKE 1 TABLET BY MOUTH EVERY DAY (Patient taking differently: Take 0.5 tablets by mouth daily.)   multivitamin-iron-minerals-folic acid (CENTRUM) chewable tablet Chew 1 tablet by mouth daily.   [DISCONTINUED] predniSONE (DELTASONE) 10 MG tablet Take 2 tablets (20 mg total) by mouth daily.   Facility-Administered Encounter Medications as of 04/26/2021  Medication   ipratropium-albuterol (DUONEB) 0.5-2.5 (3) MG/3ML nebulizer solution 3 mL    Patient Active Problem List   Diagnosis Date Noted   Encounter for colorectal cancer screening 09/12/2020   Insomnia 09/25/2015    Past Medical History:  Diagnosis Date   Hypertension     Relevant past medical, surgical, family and social history reviewed and updated as indicated. Interim medical history since our last visit reviewed.  Review of Systems Per HPI unless specifically indicated above     Objective:    BP (!) 138/96   Pulse 87   Temp 98.7 F (37.1 C)   Ht 5\' 6"  (1.676 m)   Wt 180 lb 6.4 oz (81.8 kg)   SpO2 98%   BMI 29.12 kg/m  Wt Readings from Last 3 Encounters:  04/26/21 180 lb 6.4 oz (81.8 kg)  04/12/21 200 lb (90.7 kg)  11/24/20 200 lb (90.7 kg)    Physical Exam Vitals and nursing note reviewed.  Constitutional:      General: He is not in acute distress.    Appearance: Normal appearance. He is not toxic-appearing.  HENT:     Head: Normocephalic and atraumatic.     Right Ear: Tympanic membrane, ear canal and external ear normal. There is no impacted cerumen.     Left Ear: Tympanic membrane, ear canal and external ear  normal.     Nose: Nose normal. No congestion or rhinorrhea.     Mouth/Throat:     Mouth: Mucous membranes are moist.     Pharynx: Oropharynx is clear. No oropharyngeal exudate or posterior oropharyngeal erythema.  Eyes:     General: No scleral icterus.    Extraocular Movements: Extraocular movements intact.     Pupils: Pupils are equal, round, and reactive to light.  Cardiovascular:     Rate and Rhythm: Regular rhythm.     Heart sounds: Normal heart sounds.  Pulmonary:     Effort: Pulmonary effort is normal. No tachypnea, accessory muscle usage, prolonged expiration or respiratory distress.     Breath sounds: Decreased air movement present. Examination of the right-upper field reveals decreased breath sounds. Examination of the left-upper field reveals decreased breath sounds. Decreased breath sounds present.  Abdominal:     General: Abdomen is flat.     Palpations: Abdomen is soft.  Musculoskeletal:     Cervical back: Normal range of motion.     Right lower leg: No edema.     Left lower leg: No edema.  Lymphadenopathy:     Cervical: No cervical adenopathy.  Skin:    General: Skin is warm and dry.     Capillary Refill: Capillary refill takes less than 2 seconds.     Coloration: Skin is not jaundiced or pale.     Findings: No erythema.  Neurological:     Mental Status: He is alert and oriented to person, place, and time.     Motor: No weakness.     Gait: Gait normal.  Psychiatric:        Mood and Affect: Mood normal.        Behavior: Behavior normal.        Thought Content: Thought content normal.        Judgment: Judgment normal.    Results for orders placed or performed during the hospital encounter of 04/12/21  Resp Panel by RT-PCR (Flu A&B, Covid) Nasopharyngeal Swab   Specimen: Nasopharyngeal Swab; Nasopharyngeal(NP) swabs in vial transport medium  Result Value Ref Range   SARS Coronavirus 2 by RT PCR NEGATIVE NEGATIVE   Influenza A by PCR NEGATIVE NEGATIVE    Influenza B by PCR NEGATIVE NEGATIVE  Basic metabolic panel  Result Value Ref Range   Sodium 138 135 - 145 mmol/L   Potassium 4.1 3.5 - 5.1 mmol/L   Chloride 102 98 - 111 mmol/L   CO2 27 22 - 32 mmol/L   Glucose, Bld 94 70 - 99 mg/dL   BUN 32 (H) 6 - 20 mg/dL   Creatinine, Ser 1.70 (H) 0.61 - 1.24 mg/dL   Calcium 10.4 (H) 8.9 - 10.3 mg/dL   GFR, Estimated 47 (L) >60 mL/min   Anion gap 9 5 - 15  CBC  Result Value Ref Range   WBC 10.1 4.0 -  10.5 K/uL   RBC 4.79 4.22 - 5.81 MIL/uL   Hemoglobin 16.2 13.0 - 17.0 g/dL   HCT 46.0 39.0 - 52.0 %   MCV 96.0 80.0 - 100.0 fL   MCH 33.8 26.0 - 34.0 pg   MCHC 35.2 30.0 - 36.0 g/dL   RDW 12.1 11.5 - 15.5 %   Platelets 248 150 - 400 K/uL   nRBC 0.0 0.0 - 0.2 %  Troponin I (High Sensitivity)  Result Value Ref Range   Troponin I (High Sensitivity) 4 <18 ng/L  Troponin I (High Sensitivity)  Result Value Ref Range   Troponin I (High Sensitivity) 5 <18 ng/L      Assessment & Plan:  1. Wheezing Acute.  Given history of smoking and consistent SABA use, query underlying chronic bronchitis.  Douneb given in office today for wheezing.  Patient reported no big difference in his breathing but no wheezing heard after Duoneb and air movement significantly increased in all lung fields after treatment.  Encouraged patient to use albuterol inhaler nightly before laying down for bed to see if symptoms can be prevented.  If not, consider pulmonary function testing and daily maintenance inhaler for possible COPD.  2. Nasal congestion Encouraged prompt discontinuance of nasal decongestant as this can cause rebound nasal congestion.  Continue saline nasal spray and resume flonase vs. Other OTC nasal steroid.  Follow up if symptoms do not improve and consider referral to ENT given history of rhinoplasty.   Follow up plan: Return if symptoms worsen or fail to improve.

## 2021-04-30 ENCOUNTER — Telehealth: Payer: Self-pay | Admitting: Family Medicine

## 2021-04-30 ENCOUNTER — Other Ambulatory Visit: Payer: Self-pay

## 2021-04-30 DIAGNOSIS — R062 Wheezing: Secondary | ICD-10-CM

## 2021-04-30 DIAGNOSIS — R0981 Nasal congestion: Secondary | ICD-10-CM

## 2021-04-30 DIAGNOSIS — R059 Cough, unspecified: Secondary | ICD-10-CM

## 2021-04-30 NOTE — Telephone Encounter (Signed)
Received c all from patient's Mark Guzman to follow up on referrals for pulmonologist and ENT specialist; stated patient struggled to breathe all weekend. Requesting referral in [place] area asap - patient struggling to breathe. Please advise asap at [phone] or [phone]

## 2021-04-30 NOTE — Telephone Encounter (Signed)
Spoke with wife, ENT referral has been ordered. Pt is also going today to get scan for lung order by NP at [facility]

## 2021-05-11 ENCOUNTER — Telehealth: Payer: Self-pay

## 2021-05-11 NOTE — Telephone Encounter (Signed)
Informed wife of NP request to see pt for more antibx to be rx

## 2021-05-15 ENCOUNTER — Ambulatory Visit
Admission: RE | Admit: 2021-05-15 | Discharge: 2021-05-15 | Disposition: A | Discharge status: Home / Self Care | Payer: BC Managed Care – PPO | Source: Ambulatory Visit | Attending: Nurse Practitioner | Admitting: Nurse Practitioner

## 2021-05-15 DIAGNOSIS — Z122 Encounter for screening for malignant neoplasm of respiratory organs: Secondary | ICD-10-CM

## 2021-05-15 DIAGNOSIS — F17210 Nicotine dependence, cigarettes, uncomplicated: Secondary | ICD-10-CM | POA: Diagnosis not present

## 2021-05-15 DIAGNOSIS — Z87891 Personal history of nicotine dependence: Secondary | ICD-10-CM

## 2021-05-17 ENCOUNTER — Encounter: Payer: Self-pay | Admitting: Family Medicine

## 2021-05-17 ENCOUNTER — Other Ambulatory Visit: Payer: Self-pay

## 2021-05-17 ENCOUNTER — Ambulatory Visit (INDEPENDENT_AMBULATORY_CARE_PROVIDER_SITE_OTHER): Payer: BC Managed Care – PPO | Admitting: Family Medicine

## 2021-05-17 VITALS — BP 120/74 | HR 98 | Temp 97.9°F | Resp 18 | Ht 66.0 in | Wt 186.0 lb

## 2021-05-17 DIAGNOSIS — J209 Acute bronchitis, unspecified: Secondary | ICD-10-CM | POA: Diagnosis not present

## 2021-05-17 DIAGNOSIS — J440 Chronic obstructive pulmonary disease with acute lower respiratory infection: Secondary | ICD-10-CM | POA: Diagnosis not present

## 2021-05-17 MED ORDER — PANTOPRAZOLE SODIUM 40 MG PO TBEC: 40.0000 mg | DELAYED_RELEASE_TABLET | Freq: Every day | ORAL | 3 refills | Status: DC

## 2021-05-17 NOTE — Progress Notes (Signed)
Subjective:    Patient ID: Mark Guzman, male    DOB: March 02, 1966, 55 y.o.   MRN: [id number]  Patient reports a 53-month history of cough.  He states that he went to the emergency room where he was given prednisone for a COPD exacerbation which helped.  He states that the cough went away for a week or so after the prednisone however it has returned.  He states the cough is worse at night when he lies down.  He can feel drainage draining down his throat and getting trapped in his lungs.  He feels like he is constantly having to clear his throat.  The cough is nonproductive however he keeps him awake at night and he has to sleep in a separate bedroom from his wife.  He denies any pleurisy or hemoptysis.  He quit smoking 3 or 4 weeks ago because this.  Has been using albuterol 3-4 times a day which helps temporarily but the cough continues to come back.  Recent CT scan of the lungs revealed bronchiectasis and mild bronchiolitis as well as coronary artery calcifications.  Past Medical History:  Diagnosis Date   Hypertension    Past Surgical History:  Procedure Laterality Date   ANKLE SURGERY Left    COLONOSCOPY WITH PROPOFOL N/A 11/28/2020   Procedure: COLONOSCOPY WITH PROPOFOL;  Surgeon: Daneil Dolin, MD;  Location: [facility];  Service: Endoscopy;  Laterality: N/A;  8:00am   HERNIA REPAIR     55 years old   nose repair     traumatic injury   POLYPECTOMY  11/28/2020   Procedure: POLYPECTOMY;  Surgeon: Daneil Dolin, MD;  Location: [facility];  Service: Endoscopy;;   ROTATOR CUFF REPAIR Right 2009   Dr. Daylene Katayama   Current Outpatient Medications on File Prior to Visit  Medication Sig Dispense Refill   albuterol (VENTOLIN HFA) 108 (90 Base) MCG/ACT inhaler INHALE 2 PUFFS INTO THE LUNGS EVERY 4 HOURS AS NEEDED FOR WHEEZING/SHORTNESS OF BREATH/COUGH 8.5 each 0   ALPRAZolam (XANAX) 0.5 MG tablet TAKE 1 TABLET BY MOUTH EVERY EVENING AT BEDTIME AS NEEDED FOR ANXIETY 30 tablet 1   ibuprofen  (ADVIL) 200 MG tablet Take 400 mg by mouth every 6 (six) hours as needed for mild pain.     loratadine (CLARITIN) 10 MG tablet TAKE 1 TABLET BY MOUTH EVERY DAY (Patient taking differently: Take 10 mg by mouth daily as needed for allergies.) 90 tablet 0   losartan-hydrochlorothiazide (HYZAAR) 100-25 MG tablet TAKE 1 TABLET BY MOUTH EVERY DAY (Patient taking differently: Take 0.5 tablets by mouth daily.) 90 tablet 1   multivitamin-iron-minerals-folic acid (CENTRUM) chewable tablet Chew 1 tablet by mouth daily.     No current facility-administered medications on file prior to visit.   No Known Allergies Social History   Socioeconomic History   Marital status: Married    Spouse name: Not on file   Number of children: Not on file   Years of education: Not on file   Highest education level: Not on file  Occupational History   Not on file  Tobacco Use   Smoking status: Former    Packs/day: 1.00    Years: 40.00    Pack years: 40.00    Types: Cigarettes   Smokeless tobacco: Never  Vaping Use   Vaping Use: Every day  Substance and Sexual Activity   Alcohol use: Yes    Comment: 4-6 beer daily-last use 2 days ago   Drug use: No  Sexual activity: Yes  Other Topics Concern   Not on file  Social History Narrative   Not on file   Social Determinants of Health   Financial Resource Strain: Not on file  Food Insecurity: Not on file  Transportation Needs: Not on file  Physical Activity: Not on file  Stress: Not on file  Social Connections: Not on file  Intimate Partner Violence: Not on file     Review of Systems  All other systems reviewed and are negative.     Objective:   Physical Exam Vitals reviewed.  Constitutional:      Appearance: Normal appearance. He is well-developed and normal weight.  HENT:     Head:   Neck:     Vascular: No JVD.  Cardiovascular:     Rate and Rhythm: Normal rate and regular rhythm.     Heart sounds: Normal heart sounds.  Pulmonary:      Effort: Pulmonary effort is normal. No respiratory distress.     Breath sounds: Decreased air movement present. Decreased breath sounds and wheezing present. No rales.  Abdominal:     General: Bowel sounds are normal.     Palpations: Abdomen is soft.  Musculoskeletal:     Cervical back: Neck supple.  Lymphadenopathy:     Cervical: No cervical adenopathy.  Neurological:     Mental Status: He is alert.          Assessment & Plan:   Acute bronchitis with COPD ([place]) I believe the patient likely has a diagnosis of COPD.  I would like to refer him to pulmonology for pulmonary function test for definitive diagnosis.  I believe this is the most likely cause of his chronic cough and the acute exacerbation.  However he is constantly clearing his throat which makes me believe that there may be an element of laryngal esophageal reflux particularly fact that the cough gets worse at night when he is supine.  Therefore, I will start the patient on Protonix 40 mg a day for possible acid reflux.  I am also going to start the patient on Trelegy 1 inhalation daily given the fact that he is seeing benefit from albuterol as well as prednisone and I believe that this would help as well.  I congratulated the patient on his smoking cessation.  I believe that the bronchiectasis and bronchiolitis is likely due to to inflammation in the airways due to smoking and I am hoping that over time the Trelegy will assist in improving this along with the smoking cessation.

## 2021-06-04 ENCOUNTER — Ambulatory Visit (INDEPENDENT_AMBULATORY_CARE_PROVIDER_SITE_OTHER): Payer: BC Managed Care – PPO | Admitting: Family Medicine

## 2021-06-04 ENCOUNTER — Other Ambulatory Visit: Payer: Self-pay

## 2021-06-04 ENCOUNTER — Encounter: Payer: Self-pay | Admitting: Family Medicine

## 2021-06-04 VITALS — BP 110/64 | HR 100 | Temp 98.8°F | Resp 14 | Ht 66.0 in | Wt 188.0 lb

## 2021-06-04 DIAGNOSIS — J209 Acute bronchitis, unspecified: Secondary | ICD-10-CM

## 2021-06-04 DIAGNOSIS — J440 Chronic obstructive pulmonary disease with acute lower respiratory infection: Secondary | ICD-10-CM

## 2021-06-04 MED ORDER — TRELEGY ELLIPTA 200-62.5-25 MCG/INH IN AEPB: 1.0000 | INHALATION_SPRAY | Freq: Every day | RESPIRATORY_TRACT | 11 refills | Status: DC

## 2021-06-04 NOTE — Progress Notes (Signed)
Subjective:    Patient ID: Mark Guzman, male    DOB: [birth year], 55 y.o.   MRN: [id number] 05/17/21 Patient reports a 30-monthhistory of cough.  He states that he went to the emergency room where he was given prednisone for a COPD exacerbation which helped.  He states that the cough went away for a week or so after the prednisone however it has returned.  He states the cough is worse at night when he lies down.  He can feel drainage draining down his throat and getting trapped in his lungs.  He feels like he is constantly having to clear his throat.  The cough is nonproductive however he keeps him awake at night and he has to sleep in a separate bedroom from his wife.  He denies any pleurisy or hemoptysis.  He quit smoking 3 or 4 weeks ago because this.  Has been using albuterol 3-4 times a day which helps temporarily but the cough continues to come back.  Recent CT scan of the lungs revealed bronchiectasis and mild bronchiolitis as well as coronary artery calcifications.  At that time, my plan was: I believe the patient likely has a diagnosis of COPD.  I would like to refer him to pulmonology for pulmonary function test for definitive diagnosis.  I believe this is the most likely cause of his chronic cough and the acute exacerbation.  However he is constantly clearing his throat which makes me believe that there may be an element of laryngal esophageal reflux particularly fact that the cough gets worse at night when he is supine.  Therefore, I will start the patient on Protonix 40 mg a day for possible acid reflux.  I am also going to start the patient on Trelegy 1 inhalation daily given the fact that he is seeing benefit from albuterol as well as prednisone and I believe that this would help as well.  I congratulated the patient on his smoking cessation.  I believe that the bronchiectasis and bronchiolitis is likely due to to inflammation in the airways due to smoking and I am hoping that over time the  Trelegy will assist in improving this along with the smoking cessation.  06/04/21 Patient is a very pleasant 55year old gentleman who is here today for follow-up.  He states that his breathing has improved dramatically on Trelegy.  He states that his cough is essentially gone away.  He is no longer wheezing.  He is not have any use albuterol hardly at all.  He even states that his snoring has improved.  His wife took a video of him prior to me seeing him.  At that point he was sleeping on his back and he was snoring.  He was having frequent occasional gasping sounds.  He does report hypersomnolence and we had a long discussion today regarding sleep apnea and testing for sleep apnea.  Also discussed with him the potential long-term implications of uncontrolled sleep apnea however he states that the snoring has improved dramatically since taking the Trelegy and he is no longer making those noises.  Past Medical History:  Diagnosis Date   Hypertension    Past Surgical History:  Procedure Laterality Date   ANKLE SURGERY Left    COLONOSCOPY WITH PROPOFOL N/A 11/28/2020   Procedure: COLONOSCOPY WITH PROPOFOL;  Surgeon: RDaneil Dolin MD;  Location: [facility];  Service: Endoscopy;  Laterality: N/A;  8:00am   HERNIA REPAIR     55years old  nose repair     traumatic injury   POLYPECTOMY  11/28/2020   Procedure: POLYPECTOMY;  Surgeon: Daneil Dolin, MD;  Location: [facility];  Service: Endoscopy;;   ROTATOR CUFF REPAIR Right 2009   Dr. Daylene Katayama   Current Outpatient Medications on File Prior to Visit  Medication Sig Dispense Refill   albuterol (VENTOLIN HFA) 108 (90 Base) MCG/ACT inhaler INHALE 2 PUFFS INTO THE LUNGS EVERY 4 HOURS AS NEEDED FOR WHEEZING/SHORTNESS OF BREATH/COUGH 8.5 each 0   ALPRAZolam (XANAX) 0.5 MG tablet TAKE 1 TABLET BY MOUTH EVERY EVENING AT BEDTIME AS NEEDED FOR ANXIETY 30 tablet 1   ibuprofen (ADVIL) 200 MG tablet Take 400 mg by mouth every 6 (six) hours as needed for  mild pain.     loratadine (CLARITIN) 10 MG tablet TAKE 1 TABLET BY MOUTH EVERY DAY (Patient taking differently: Take 10 mg by mouth daily as needed for allergies.) 90 tablet 0   losartan-hydrochlorothiazide (HYZAAR) 100-25 MG tablet TAKE 1 TABLET BY MOUTH EVERY DAY (Patient taking differently: Take 0.5 tablets by mouth daily.) 90 tablet 1   multivitamin-iron-minerals-folic acid (CENTRUM) chewable tablet Chew 1 tablet by mouth daily.     pantoprazole (PROTONIX) 40 MG tablet Take 1 tablet (40 mg total) by mouth daily. 30 tablet 3   No current facility-administered medications on file prior to visit.   No Known Allergies Social History   Socioeconomic History   Marital status: Married    Spouse name: Not on file   Number of children: Not on file   Years of education: Not on file   Highest education level: Not on file  Occupational History   Not on file  Tobacco Use   Smoking status: Former    Packs/day: 1.00    Years: 40.00    Pack years: 40.00    Types: Cigarettes   Smokeless tobacco: Never  Vaping Use   Vaping Use: Every day  Substance and Sexual Activity   Alcohol use: Yes    Comment: 4-6 beer daily-last use 2 days ago   Drug use: No   Sexual activity: Yes  Other Topics Concern   Not on file  Social History Narrative   Not on file   Social Determinants of Health   Financial Resource Strain: Not on file  Food Insecurity: Not on file  Transportation Needs: Not on file  Physical Activity: Not on file  Stress: Not on file  Social Connections: Not on file  Intimate Partner Violence: Not on file     Review of Systems  All other systems reviewed and are negative.     Objective:   Physical Exam Vitals reviewed.  Constitutional:      Appearance: Normal appearance. He is well-developed and normal weight.  HENT:     Head:   Neck:     Vascular: No JVD.  Cardiovascular:     Rate and Rhythm: Normal rate and regular rhythm.     Heart sounds: Normal heart sounds.   Pulmonary:     Effort: Pulmonary effort is normal. No respiratory distress.     Breath sounds: Normal breath sounds. No wheezing or rales.  Abdominal:     General: Bowel sounds are normal.     Palpations: Abdomen is soft.  Musculoskeletal:     Cervical back: Neck supple.  Lymphadenopathy:     Cervical: No cervical adenopathy.  Neurological:     Mental Status: He is alert.  Assessment & Plan:  Acute bronchitis with COPD ([place]) Bronchitis has resolved.  Continue to encourage the patient to refrain from smoking as he is currently doing.  Continue Trelegy 1 inhalation daily.  Use albuterol as needed for wheezing.  Recommended a sleep study to evaluate for obstructive sleep apnea but he politely deferred at the present time

## 2021-06-06 ENCOUNTER — Telehealth: Payer: Self-pay

## 2021-06-06 ENCOUNTER — Ambulatory Visit (INDEPENDENT_AMBULATORY_CARE_PROVIDER_SITE_OTHER): Payer: BC Managed Care – PPO | Admitting: Otolaryngology

## 2021-06-06 NOTE — Telephone Encounter (Signed)
Received request from pharmacy for PA on Trelegy.   PA submitted.   Dx:J44.0- COPD, J20.9- bronchitis  Received the following from patient nsurance: This request cannot be processed due to the medication is not covered by the plan.  Please advise.

## 2021-06-06 NOTE — Telephone Encounter (Signed)
Pt's spouse called in stating that pt needs to get a prior authorization sent to pharmacy for Fluticasone-Umeclidin-[facility] ELLIPTA) 200-62.5-25 MCG/INH. Please call wife if this can be done soon.   Cb#: [id number]

## 2021-06-07 NOTE — Telephone Encounter (Signed)
Call placed to patient to discuss.   Reports that he received call from insurance company reporting that Trelegy is covered for $25 co-pay.   Call placed to pharmacy. Was advised that medication continues to state it requires PA.   Re-submitted PA.  [facility] is reviewing your PA request and will respond within 24 hours for Medicaid or up to 72 hours for non-Medicaid plans, based on the required timeframe determined by state or federal regulations

## 2021-06-13 MED ORDER — TRELEGY ELLIPTA 200-62.5-25 MCG/INH IN AEPB: 1.0000 | INHALATION_SPRAY | Freq: Every day | RESPIRATORY_TRACT | 11 refills | Status: DC

## 2021-06-13 NOTE — Telephone Encounter (Signed)
Received PA determination.   [place] [id number] approved 06/13/2021- 06/12/2022.

## 2021-06-28 ENCOUNTER — Other Ambulatory Visit: Payer: Self-pay | Admitting: Family Medicine

## 2021-06-28 DIAGNOSIS — F5101 Primary insomnia: Secondary | ICD-10-CM

## 2021-06-28 NOTE — Telephone Encounter (Signed)
Ok to refill??  Last office visit 06/04/2021.  Last refill 04/26/2021, #1 refill.

## 2021-08-11 ENCOUNTER — Other Ambulatory Visit: Payer: Self-pay | Admitting: Family Medicine

## 2021-08-12 ENCOUNTER — Other Ambulatory Visit: Payer: Self-pay | Admitting: Family Medicine

## 2021-08-29 ENCOUNTER — Other Ambulatory Visit: Payer: Self-pay | Admitting: Family Medicine

## 2021-08-29 DIAGNOSIS — F5101 Primary insomnia: Secondary | ICD-10-CM

## 2021-08-29 NOTE — Telephone Encounter (Signed)
Ok to refill??  Last office visit 06/04/2021.  Last refill 06/29/2021, #1 refill.

## 2021-10-30 ENCOUNTER — Other Ambulatory Visit: Payer: Self-pay | Admitting: Family Medicine

## 2021-10-30 DIAGNOSIS — F5101 Primary insomnia: Secondary | ICD-10-CM

## 2021-11-15 ENCOUNTER — Other Ambulatory Visit: Payer: Self-pay | Admitting: Family Medicine

## 2021-11-20 NOTE — Progress Notes (Signed)
(  Key: [id number])Need help? Call us at [phone] Status Sent to [employer] The plan will fax you a determination, typically within 1 to 5 business days.  How do I follow up? Drug Pantoprazole Sodium 40MG  dr tablets Form [employer] [place] Medicare Part D Coverage Determination Form Coverage Determination Request Form for Medicare Part D members only. [phone] [phone]

## 2021-12-05 NOTE — Progress Notes (Signed)
PA for Pantoprazole was DENIED: There are no benefits for this medication as part of your plan coverage.  Please advise alternative, thank you!

## 2022-01-02 ENCOUNTER — Other Ambulatory Visit: Payer: Self-pay | Admitting: Family Medicine

## 2022-01-02 DIAGNOSIS — F5101 Primary insomnia: Secondary | ICD-10-CM

## 2022-01-02 NOTE — Telephone Encounter (Signed)
LOV 06/04/21 Last refill 10/30/21, #30, 1 refill  Please review, thanks!

## 2022-02-26 ENCOUNTER — Other Ambulatory Visit: Payer: Self-pay | Admitting: Family Medicine

## 2022-02-26 DIAGNOSIS — I10 Essential (primary) hypertension: Secondary | ICD-10-CM

## 2022-03-07 ENCOUNTER — Other Ambulatory Visit: Payer: Self-pay | Admitting: Family Medicine

## 2022-03-07 DIAGNOSIS — F5101 Primary insomnia: Secondary | ICD-10-CM

## 2022-03-07 NOTE — Telephone Encounter (Signed)
LOV 06/04/21 ?Last refill 01/03/22, #30, 1 refills ? ?Please review, thanks! ? ?

## 2022-04-22 DIAGNOSIS — C44319 Basal cell carcinoma of skin of other parts of face: Secondary | ICD-10-CM | POA: Diagnosis not present

## 2022-04-22 DIAGNOSIS — C44619 Basal cell carcinoma of skin of left upper limb, including shoulder: Secondary | ICD-10-CM | POA: Diagnosis not present

## 2022-05-10 ENCOUNTER — Other Ambulatory Visit: Payer: Self-pay | Admitting: Family Medicine

## 2022-05-10 DIAGNOSIS — F5101 Primary insomnia: Secondary | ICD-10-CM

## 2022-05-10 NOTE — Telephone Encounter (Signed)
Requested medication (s) are due for refill today:   Provider to review  Requested medication (s) are on the active medication list:   Yes  Future visit scheduled:   Yes   Last ordered: 03/07/2022 #30, 1 refill  Non delegated refill reason it was returned   Requested Prescriptions  Pending Prescriptions Disp Refills   ALPRAZolam (XANAX) 0.5 MG tablet [Pharmacy Med Name: ALPRAZOLAM 0.5 MG TABLET] 30 tablet 1    Sig: TAKE 1 TABLET BY MOUTH EVERY EVENING AT BEDTIME AS NEEDED FOR ANXIETY     Not Delegated - Psychiatry: Anxiolytics/Hypnotics 2 Failed - 05/10/2022  1:00 PM      Failed - This refill cannot be delegated      Failed - Urine Drug Screen completed in last 360 days      Failed - Valid encounter within last 6 months    Recent Outpatient Visits           11 months ago Acute bronchitis with COPD ([place])   [place] Susy Frizzle, MD   11 months ago Acute bronchitis with COPD ([place])   [place] Susy Frizzle, MD   1 year ago [place] Eulogio Bear, NP   1 year ago Cough   [place] Eulogio Bear, NP   1 year ago Skin lesion of face   [place], Cammie Mcgee, MD       Future Appointments             In 2 weeks Pickard, Cammie Mcgee, MD [place], [place] - Patient is not pregnant

## 2022-05-10 NOTE — Telephone Encounter (Signed)
Patient called, left VM to return the call to the office to scheduled an appt for medication refill request.   

## 2022-05-10 NOTE — Telephone Encounter (Signed)
Requested medication (s) are due for refill today: yes  Requested medication (s) are on the active medication list: yes  Last refill:  03/07/22 #30/1  Future visit scheduled: no  Notes to clinic:  Unable to refill per protocol, cannot delegate. Pt called, LVMTCB to schedule appt     Requested Prescriptions  Pending Prescriptions Disp Refills   ALPRAZolam (XANAX) 0.5 MG tablet [Pharmacy Med Name: ALPRAZOLAM 0.5 MG TABLET] 30 tablet 1    Sig: TAKE 1 TABLET BY MOUTH EVERY EVENING AT BEDTIME AS NEEDED FOR ANXIETY     Not Delegated - Psychiatry: Anxiolytics/Hypnotics 2 Failed - 05/10/2022  8:32 AM      Failed - This refill cannot be delegated      Failed - Urine Drug Screen completed in last 360 days      Failed - Valid encounter within last 6 months    Recent Outpatient Visits           11 months ago Acute bronchitis with COPD ([place])   [place] Susy Frizzle, MD   11 months ago Acute bronchitis with COPD ([place])   [place] Susy Frizzle, MD   1 year ago [place] Eulogio Bear, NP   1 year ago Cough   [place] Eulogio Bear, NP   1 year ago Skin lesion of face   [place], Cammie Mcgee, MD              Passed - Patient is not pregnant

## 2022-05-10 NOTE — Telephone Encounter (Signed)
Pt's wife called in to schedule pt for an ov to discuss refills. Pt is scheduled for 7/18. Pt is asking for courtesy refill of meds enough to last until this appt please. Please advise.  Cb#: [phone]

## 2022-05-21 ENCOUNTER — Other Ambulatory Visit: Payer: Self-pay

## 2022-05-21 DIAGNOSIS — F5101 Primary insomnia: Secondary | ICD-10-CM

## 2022-05-21 MED ORDER — ALPRAZOLAM 0.5 MG PO TABS: 0.5000 mg | ORAL_TABLET | Freq: Three times a day (TID) | ORAL | 1 refills | Status: DC | PRN

## 2022-05-21 NOTE — Telephone Encounter (Signed)
LOV 06/04/21 Last refill 03/07/22, #30, 1 refills  Pt has appt on 05/28/22  Please review, thanks!

## 2022-05-27 DIAGNOSIS — Z85828 Personal history of other malignant neoplasm of skin: Secondary | ICD-10-CM | POA: Diagnosis not present

## 2022-05-27 DIAGNOSIS — Z08 Encounter for follow-up examination after completed treatment for malignant neoplasm: Secondary | ICD-10-CM | POA: Diagnosis not present

## 2022-05-28 ENCOUNTER — Ambulatory Visit: Payer: BC Managed Care – PPO | Admitting: Family Medicine

## 2022-05-28 VITALS — BP 120/62 | HR 79 | Temp 98.2°F | Ht 66.0 in | Wt 188.4 lb

## 2022-05-28 DIAGNOSIS — F5101 Primary insomnia: Secondary | ICD-10-CM | POA: Diagnosis not present

## 2022-05-28 DIAGNOSIS — I10 Essential (primary) hypertension: Secondary | ICD-10-CM

## 2022-05-28 DIAGNOSIS — J42 Unspecified chronic bronchitis: Secondary | ICD-10-CM | POA: Diagnosis not present

## 2022-05-28 MED ORDER — PANTOPRAZOLE SODIUM 40 MG PO TBEC: 40.0000 mg | DELAYED_RELEASE_TABLET | Freq: Every day | ORAL | 0 refills | Status: DC

## 2022-05-28 MED ORDER — ALPRAZOLAM 1 MG PO TABS: 1.0000 mg | ORAL_TABLET | Freq: Every evening | ORAL | 2 refills | Status: DC | PRN

## 2022-05-28 MED ORDER — ESCITALOPRAM OXALATE 10 MG PO TABS: 10.0000 mg | ORAL_TABLET | Freq: Every day | ORAL | 2 refills | Status: DC
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
## 2022-05-29 LAB — COMPLETE METABOLIC PANEL WITH GFR
AG Ratio: 2.1 (calc) (ref 1.0–2.5)
ALT: 31 U/L (ref 9–46)
AST: 20 U/L (ref 10–35)
Albumin: 4.5 g/dL (ref 3.6–5.1)
Alkaline phosphatase (APISO): 68 U/L (ref 35–144)
BUN: 20 mg/dL (ref 7–25)
CO2: 28 mmol/L (ref 20–32)
Calcium: 9.8 mg/dL (ref 8.6–10.3)
Chloride: 103 mmol/L (ref 98–110)
Creat: 1.04 mg/dL (ref 0.70–1.30)
Globulin: 2.1 g/dL (calc) (ref 1.9–3.7)
Glucose, Bld: 103 mg/dL — ABNORMAL HIGH (ref 65–99)
Potassium: 4.5 mmol/L (ref 3.5–5.3)
Sodium: 140 mmol/L (ref 135–146)
Total Bilirubin: 1 mg/dL (ref 0.2–1.2)
Total Protein: 6.6 g/dL (ref 6.1–8.1)
eGFR: 84 mL/min/{1.73_m2} (ref >=60)

## 2022-05-29 LAB — CBC WITH DIFFERENTIAL/PLATELET
Absolute Monocytes: 592 cells/uL (ref 200–950)
Basophils Absolute: 41 cells/uL (ref 0–200)
Basophils Relative: 0.6 %
Eosinophils Absolute: 129 cells/uL (ref 15–500)
Eosinophils Relative: 1.9 %
HCT: 48.4 % (ref 38.5–50.0)
Hemoglobin: 16.3 g/dL (ref 13.2–17.1)
Lymphs Abs: 2196 cells/uL (ref 850–3900)
MCH: 32.7 pg (ref 27.0–33.0)
MCHC: 33.7 g/dL (ref 32.0–36.0)
MCV: 97.2 fL (ref 80.0–100.0)
MPV: 9.2 fL (ref 7.5–12.5)
Monocytes Relative: 8.7 %
Neutro Abs: 3842 cells/uL (ref 1500–7800)
Neutrophils Relative %: 56.5 %
Platelets: 277 10*3/uL (ref 140–400)
RBC: 4.98 10*6/uL (ref 4.20–5.80)
RDW: 12.2 % (ref 11.0–15.0)
Total Lymphocyte: 32.3 %
WBC: 6.8 10*3/uL (ref 3.8–10.8)

## 2022-05-29 LAB — LIPID PANEL
Cholesterol: 221 mg/dL — ABNORMAL HIGH (ref <200)
HDL: 52 mg/dL (ref >=40)
LDL Cholesterol (Calc): 140 mg/dL (calc) — ABNORMAL HIGH
Non-HDL Cholesterol (Calc): 169 mg/dL (calc) — ABNORMAL HIGH (ref <130)
Total CHOL/HDL Ratio: 4.3 (calc) (ref <5.0)
Triglycerides: 157 mg/dL — ABNORMAL HIGH (ref <150)

## 2022-05-31 ENCOUNTER — Other Ambulatory Visit: Payer: Self-pay

## 2022-05-31 MED ORDER — ROSUVASTATIN CALCIUM 10 MG PO TABS: 10.0000 mg | ORAL_TABLET | Freq: Every day | ORAL | 3 refills | Status: DC

## 2022-06-07 ENCOUNTER — Other Ambulatory Visit: Payer: Self-pay | Admitting: Family Medicine

## 2022-06-07 DIAGNOSIS — I10 Essential (primary) hypertension: Secondary | ICD-10-CM

## 2022-06-10 NOTE — Telephone Encounter (Signed)
Refilled 02/26/2022 #90 1 refill - confirmed by pharmacy. Requested Prescriptions  Pending Prescriptions Disp Refills  . losartan-hydrochlorothiazide (HYZAAR) 100-25 MG tablet [Pharmacy Med Name: LOSARTAN-HCTZ 100-25 MG TAB] 90 tablet 1    Sig: TAKE 1 TABLET BY MOUTH EVERY DAY     Cardiovascular: ARB + Diuretic Combos Failed - 06/07/2022  4:03 PM      Failed - Valid encounter within last 6 months    Recent Outpatient Visits          1 year ago Acute bronchitis with COPD ([place])   [place] Pickard, Cammie Mcgee, MD   1 year ago Acute bronchitis with COPD ([place])   [place] Pickard, Cammie Mcgee, MD   1 year ago [place] Eulogio Bear, NP   1 year ago Cough   [place] Eulogio Bear, NP   1 year ago Skin lesion of face   [place] Susy Frizzle, MD             Passed - K in normal range and within 180 days    Potassium  Date Value Ref Range Status  05/28/2022 4.5 3.5 - 5.3 mmol/L Final         Passed - Na in normal range and within 180 days    Sodium  Date Value Ref Range Status  05/28/2022 140 135 - 146 mmol/L Final         Passed - Cr in normal range and within 180 days    Creat  Date Value Ref Range Status  05/28/2022 1.04 0.70 - 1.30 mg/dL Final         Passed - eGFR is 10 or above and within 180 days    GFR, Est African American  Date Value Ref Range Status  06/02/2020 75 > OR = 60 mL/min/1.74m Final   GFR, Est Non African American  Date Value Ref Range Status  06/02/2020 65 > OR = 60 mL/min/1.786mFinal   GFR, Estimated  Date Value Ref Range Status  04/12/2021 47 (L) >60 mL/min Final    Comment:    (NOTE) Calculated using the CKD-EPI Creatinine Equation (2021)    eGFR  Date Value Ref Range Status  05/28/2022 84 > OR = 60 mL/min/1.7364minal    Comment:    The eGFR is based on the CKD-EPI 2021 equation. To calculate  the new eGFR from a  previous Creatinine or Cystatin C result, go to https://www.kidney.org/professionals/ kdoqi/gfr%5Fcalculator          Passed - Patient is not pregnant      Passed - Last BP in normal range    BP Readings from Last 1 Encounters:  05/28/22 120/62

## 2022-06-20 ENCOUNTER — Other Ambulatory Visit: Payer: Self-pay | Admitting: Family Medicine

## 2022-07-01 ENCOUNTER — Other Ambulatory Visit: Payer: Self-pay | Admitting: Family Medicine

## 2022-07-29 DIAGNOSIS — Z85828 Personal history of other malignant neoplasm of skin: Secondary | ICD-10-CM | POA: Diagnosis not present

## 2022-07-29 DIAGNOSIS — Z08 Encounter for follow-up examination after completed treatment for malignant neoplasm: Secondary | ICD-10-CM | POA: Diagnosis not present

## 2022-08-29 ENCOUNTER — Other Ambulatory Visit: Payer: Self-pay | Admitting: Family Medicine

## 2022-08-29 DIAGNOSIS — I10 Essential (primary) hypertension: Secondary | ICD-10-CM

## 2022-08-29 NOTE — Telephone Encounter (Signed)
OV 05/28/22  Requested Prescriptions  Pending Prescriptions Disp Refills  . losartan-hydrochlorothiazide (HYZAAR) 100-25 MG tablet [Pharmacy Med Name: LOSARTAN-HCTZ 100-25 MG TAB] 90 tablet 1    Sig: TAKE 1 TABLET BY MOUTH EVERY DAY     Cardiovascular: ARB + Diuretic Combos Failed - 08/29/2022  3:34 AM      Failed - Valid encounter within last 6 months    Recent Outpatient Visits          1 year ago Acute bronchitis with COPD ([place])   [place] Pickard, Cammie Mcgee, MD   1 year ago Acute bronchitis with COPD ([place])   [place] Pickard, Cammie Mcgee, MD   1 year ago [place] Eulogio Bear, NP   1 year ago Cough   [place] Eulogio Bear, NP   2 years ago Skin lesion of face   [place], Cammie Mcgee, MD             Passed - K in normal range and within 180 days    Potassium  Date Value Ref Range Status  05/28/2022 4.5 3.5 - 5.3 mmol/L Final         Passed - Na in normal range and within 180 days    Sodium  Date Value Ref Range Status  05/28/2022 140 135 - 146 mmol/L Final         Passed - Cr in normal range and within 180 days    Creat  Date Value Ref Range Status  05/28/2022 1.04 0.70 - 1.30 mg/dL Final         Passed - eGFR is 10 or above and within 180 days    GFR, Est African American  Date Value Ref Range Status  06/02/2020 75 > OR = 60 mL/min/1.54m2 Final   GFR, Est Non African American  Date Value Ref Range Status  06/02/2020 65 > OR = 60 mL/min/1.65m2 Final   GFR, Estimated  Date Value Ref Range Status  04/12/2021 47 (L) >60 mL/min Final    Comment:    (NOTE) Calculated using the CKD-EPI Creatinine Equation (2021)    eGFR  Date Value Ref Range Status  05/28/2022 84 > OR = 60 mL/min/1.28m2 Final    Comment:    The eGFR is based on the CKD-EPI 2021 equation. To calculate  the new eGFR from a previous Creatinine or Cystatin C result,  go to https://www.kidney.org/professionals/ kdoqi/gfr%5Fcalculator          Passed - Patient is not pregnant      Passed - Last BP in normal range    BP Readings from Last 1 Encounters:  05/28/22 120/62

## 2022-09-06 ENCOUNTER — Other Ambulatory Visit: Payer: BC Managed Care – PPO

## 2022-09-06 DIAGNOSIS — E785 Hyperlipidemia, unspecified: Secondary | ICD-10-CM

## 2022-09-07 LAB — LIPID PANEL
Cholesterol: 143 mg/dL (ref <200)
HDL: 55 mg/dL (ref >=40)
LDL Cholesterol (Calc): 73 mg/dL (calc)
Non-HDL Cholesterol (Calc): 88 mg/dL (calc) (ref <130)
Total CHOL/HDL Ratio: 2.6 (calc) (ref <5.0)
Triglycerides: 70 mg/dL (ref <150)

## 2022-09-16 ENCOUNTER — Other Ambulatory Visit: Payer: Self-pay | Admitting: Family Medicine

## 2022-09-16 DIAGNOSIS — F5101 Primary insomnia: Secondary | ICD-10-CM

## 2022-09-20 ENCOUNTER — Telehealth: Payer: Self-pay

## 2022-09-20 ENCOUNTER — Encounter: Payer: Self-pay | Admitting: Family Medicine

## 2022-09-20 NOTE — Telephone Encounter (Signed)
Pt's wife called in stating that pt has tested positive for Covid. Pt states symptoms  are really bad headache, bad cough, fever and chills. Pt's wife is concerned because of pt's COPD. Pt would like to know if a med can be sent to pharmacy for pt. Pt's wife would like a cb please. Please advise.  Cb#: [phone]  [facility]  [id number] [place] [place]

## 2022-09-23 ENCOUNTER — Other Ambulatory Visit: Payer: Self-pay | Admitting: Family Medicine

## 2022-09-23 MED ORDER — NIRMATRELVIR/RITONAVIR (PAXLOVID)TABLET: 3.0000 | ORAL_TABLET | Freq: Two times a day (BID) | ORAL | 0 refills | Status: AC

## 2022-09-23 NOTE — Telephone Encounter (Signed)
Called spoke w/pt and is aware Rx has been sent to pharmacy

## 2022-11-14 ENCOUNTER — Other Ambulatory Visit: Payer: Self-pay

## 2022-11-14 MED ORDER — PANTOPRAZOLE SODIUM 40 MG PO TBEC: 40.0000 mg | DELAYED_RELEASE_TABLET | Freq: Every day | ORAL | 0 refills | Status: DC

## 2022-11-14 NOTE — Telephone Encounter (Signed)
Requested medications are due for refill today.  Unsure  Requested medications are on the active medications list.  yes  Last refill. 05/28/2022   Future visit scheduled.   no  Notes to clinic.  Rx written to expire 06/27/2022 - Rx is expired.    Requested Prescriptions  Pending Prescriptions Disp Refills   pantoprazole (PROTONIX) 40 MG tablet 30 tablet 0    Sig: Take 1 tablet (40 mg total) by mouth daily.     Gastroenterology: Proton Pump Inhibitors Failed - 11/14/2022  3:59 PM      Failed - Valid encounter within last 12 months    Recent Outpatient Visits           1 year ago Acute bronchitis with COPD ([place])   [place] Pickard, Cammie Mcgee, MD   1 year ago Acute bronchitis with COPD ([place])   [place] Susy Frizzle, MD   1 year ago [place] Eulogio Bear, NP   1 year ago Cough   [place], NP   2 years ago Skin lesion of face   [place] Pickard, Cammie Mcgee, MD

## 2022-11-14 NOTE — Telephone Encounter (Signed)
Prescription Request  11/14/2022  Is this a "Controlled Substance" medicine? No  LOV: 09/06/22  What is the name of the medication or equipment? pantoprazole (PROTONIX) 40 MG tablet [[id number]]  ENDED  Have you contacted your pharmacy to request a refill? Yes   Which pharmacy would you like this sent to?  [id number] [id number]: [phone]: [phone]   Patient notified that their request is being sent to the clinical staff for review and that they should receive a response within 2 business days.   [phone]

## 2022-12-31 ENCOUNTER — Other Ambulatory Visit: Payer: Self-pay | Admitting: Family Medicine

## 2022-12-31 DIAGNOSIS — F5101 Primary insomnia: Secondary | ICD-10-CM

## 2023-01-01 NOTE — Telephone Encounter (Signed)
Requested medication (s) are due for refill today - yes  Requested medication (s) are on the active medication list -yes  Future visit scheduled -no  Last refill: 09/17/22 #30 2RF  Notes to clinic: non delegated Rx  Requested Prescriptions  Pending Prescriptions Disp Refills   ALPRAZolam (XANAX) 1 MG tablet [Pharmacy Med Name: ALPRAZOLAM 1 MG TABLET] 30 tablet     Sig: TAKE 1 TABLET BY MOUTH AT BEDTIME AS NEEDED FOR ANXIETY.     Not Delegated - Psychiatry: Anxiolytics/Hypnotics 2 Failed - 12/31/2022  4:57 PM      Failed - This refill cannot be delegated      Failed - Urine Drug Screen completed in last 360 days      Failed - Valid encounter within last 6 months    Recent Outpatient Visits           1 year ago Acute bronchitis with COPD ([place])   [place] Pickard, Cammie Mcgee, MD   1 year ago Acute bronchitis with COPD ([place])   [place] Pickard, Cammie Mcgee, MD   1 year ago [place] Eulogio Bear, NP   1 year ago Cough   [place], Jessica A, NP   2 years ago Skin lesion of face   [place], Cammie Mcgee, MD              Passed - Patient is not pregnant         Requested Prescriptions  Pending Prescriptions Disp Refills   ALPRAZolam (XANAX) 1 MG tablet [Pharmacy Med Name: ALPRAZOLAM 1 MG TABLET] 30 tablet     Sig: TAKE 1 TABLET BY MOUTH AT BEDTIME AS NEEDED FOR ANXIETY.     Not Delegated - Psychiatry: Anxiolytics/Hypnotics 2 Failed - 12/31/2022  4:57 PM      Failed - This refill cannot be delegated      Failed - Urine Drug Screen completed in last 360 days      Failed - Valid encounter within last 6 months    Recent Outpatient Visits           1 year ago Acute bronchitis with COPD ([place])   [place] Pickard, Cammie Mcgee, MD   1 year ago Acute bronchitis with COPD ([place])   [place] Susy Frizzle, MD    1 year ago [place] Eulogio Bear, NP   1 year ago Cough   [place], Jessica A, NP   2 years ago Skin lesion of face   [place], Cammie Mcgee, MD              Passed - Patient is not pregnant

## 2023-01-12 ENCOUNTER — Other Ambulatory Visit: Payer: Self-pay | Admitting: Family Medicine

## 2023-02-05 ENCOUNTER — Other Ambulatory Visit: Payer: Self-pay | Admitting: Family Medicine

## 2023-02-06 NOTE — Telephone Encounter (Signed)
Requested Prescriptions  Pending Prescriptions Disp Refills   pantoprazole (PROTONIX) 40 MG tablet [Pharmacy Med Name: PANTOPRAZOLE SOD DR 40 MG TAB] 90 tablet 0    Sig: TAKE 1 TABLET BY MOUTH EVERY DAY     Gastroenterology: Proton Pump Inhibitors Failed - 02/05/2023  3:54 PM      Failed - Valid encounter within last 12 months    Recent Outpatient Visits           1 year ago Acute bronchitis with COPD ([place])   [place] Pickard, Cammie Mcgee, MD   1 year ago Acute bronchitis with COPD ([place])   [place] Susy Frizzle, MD   1 year ago [place] Eulogio Bear, NP   1 year ago Cough   [place], NP   2 years ago Skin lesion of face   [place] Pickard, Cammie Mcgee, MD

## 2023-03-05 IMAGING — CT CT CHEST LUNG CANCER SCREENING LOW DOSE W/O CM
2 of 5 series · 15 of 40 positions shown, 18 images · non-contrast
Comparison: None.

CLINICAL DATA: Current smoker, 40 pack-year history.

EXAM:
CT CHEST WITHOUT CONTRAST LOW-DOSE FOR LUNG CANCER SCREENING
TECHNIQUE: Multidetector CT imaging of the chest was performed following the
standard protocol without IV contrast.

[Series 4: lung 1.00 br44 cor · coronal · 0.70mm/px · 3 of 321 slices shown]
[im 65/321  lung]
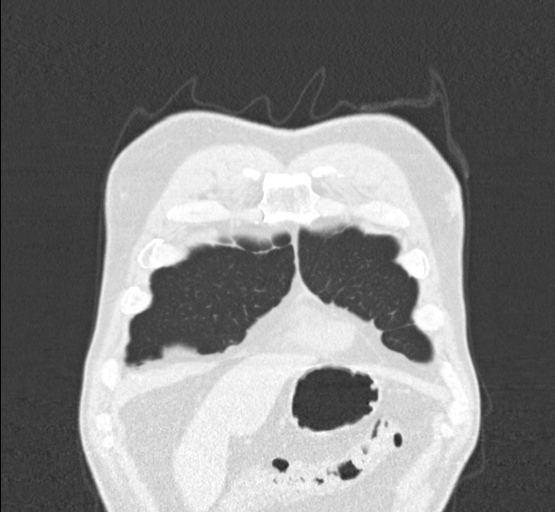
[im 129/321  lung]
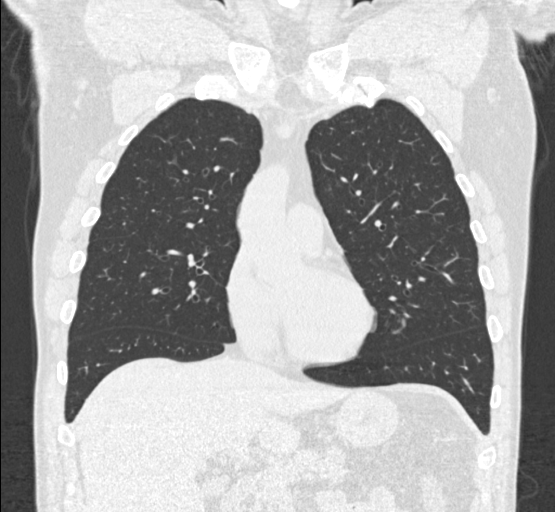
[im 193/321  lung]
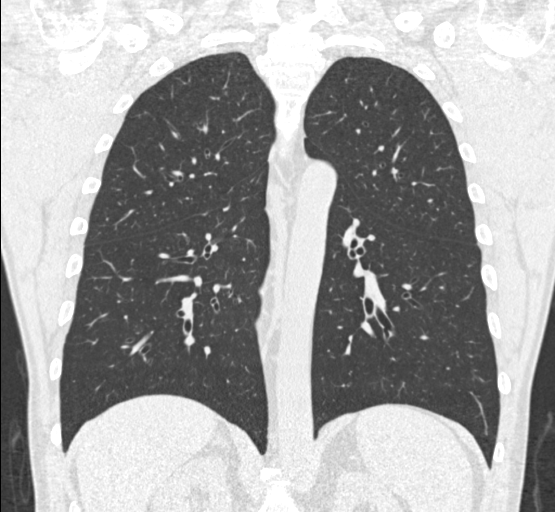

[Series 9: lung 1.00 br60 axial · axial · 0.75mm/px · z∈[-1314,-992]mm · 12 of 356 slices shown, 15 images]
[im 17/356  mediastinal]
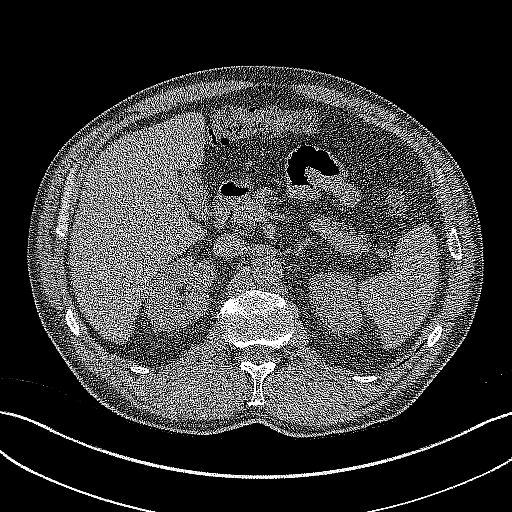
[im 17/356  lung]
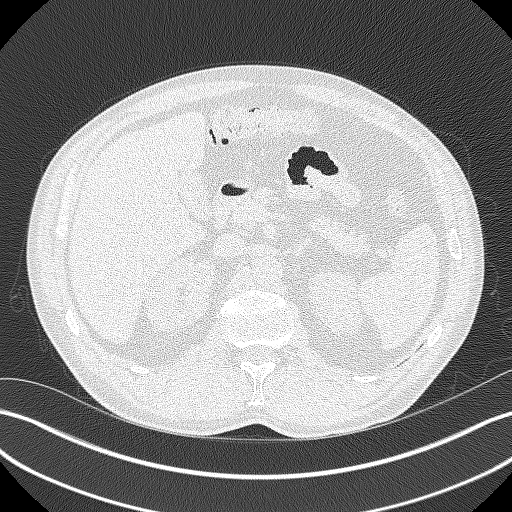
[im 49/356  lung]
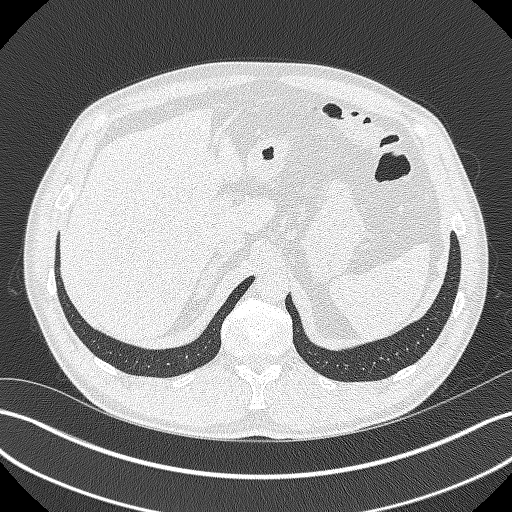
[im 81/356  lung]
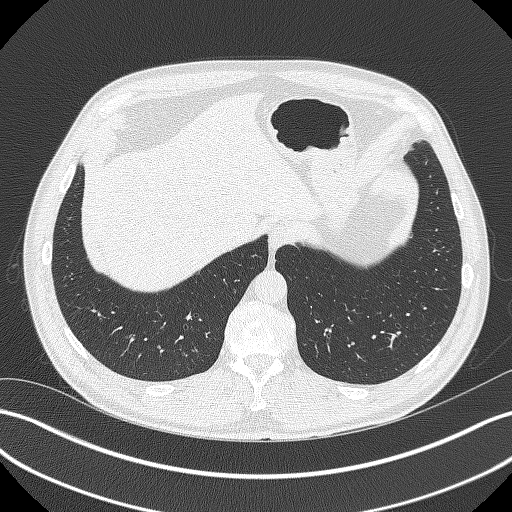
[im 113/356  lung]
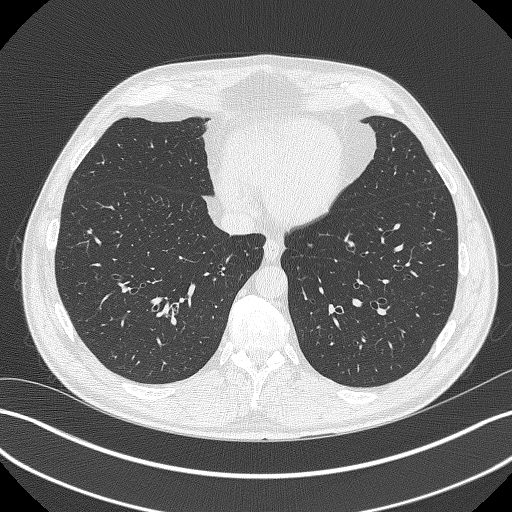
[im 130/356  mediastinal]
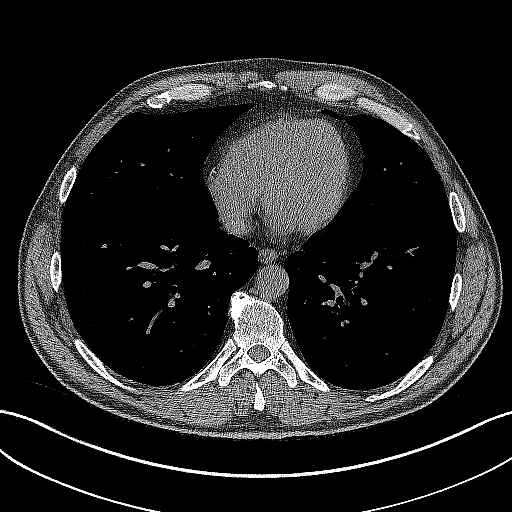
[im 130/356  lung]
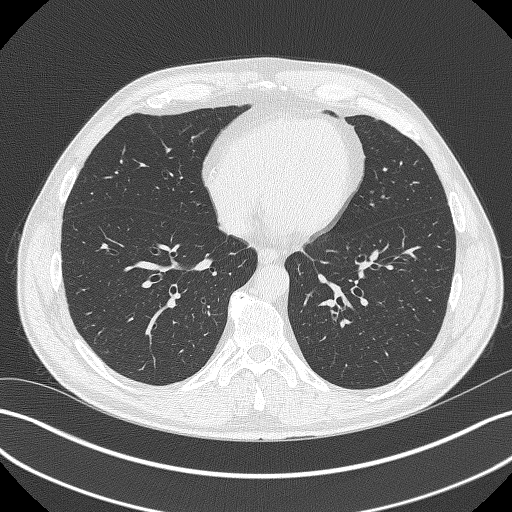
[im 162/356  lung]
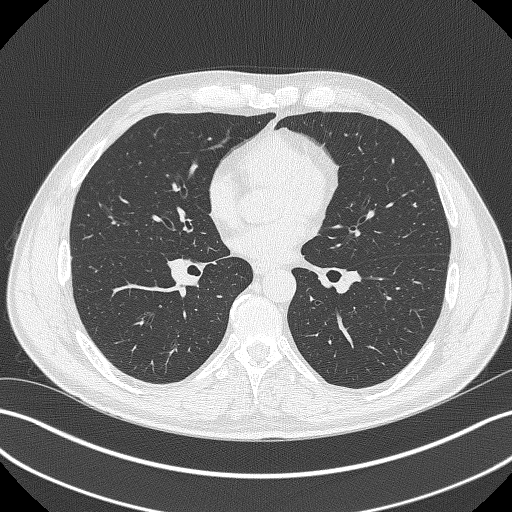
[im 194/356  lung]
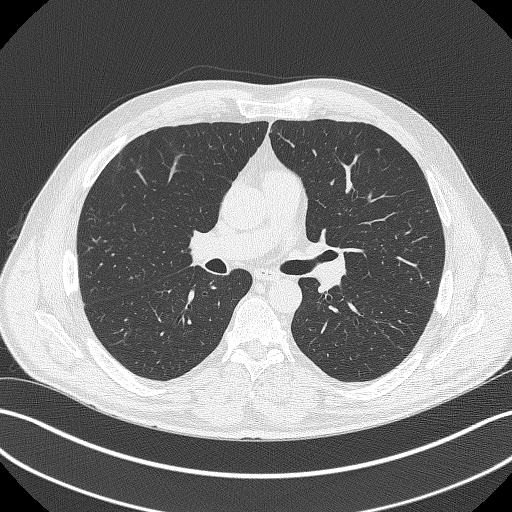
[im 226/356  lung]
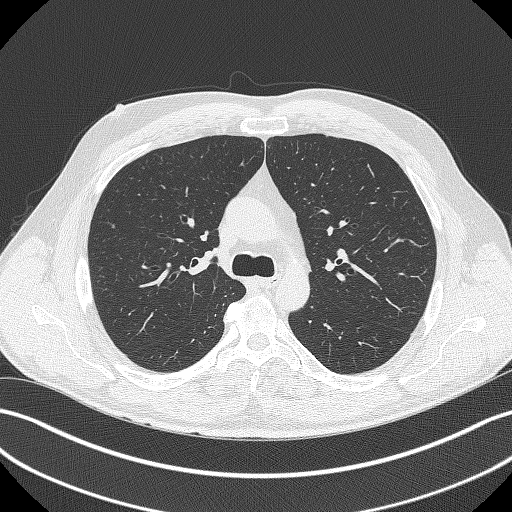
[im 243/356  mediastinal]
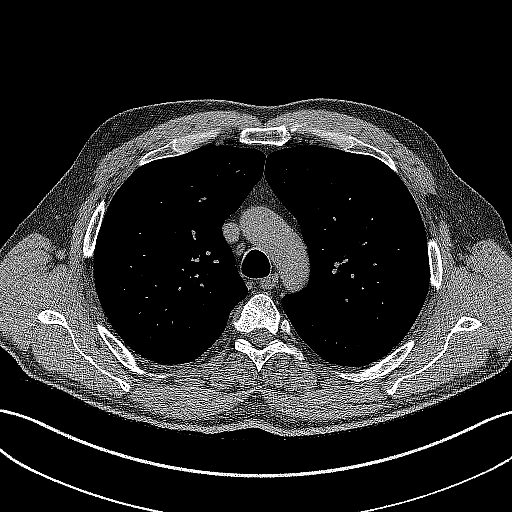
[im 243/356  lung]
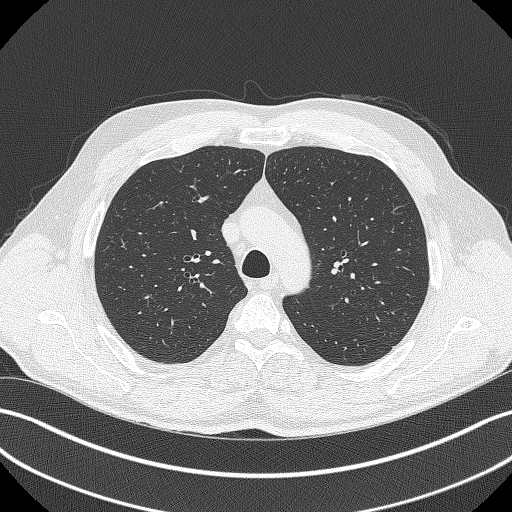
[im 275/356  lung]
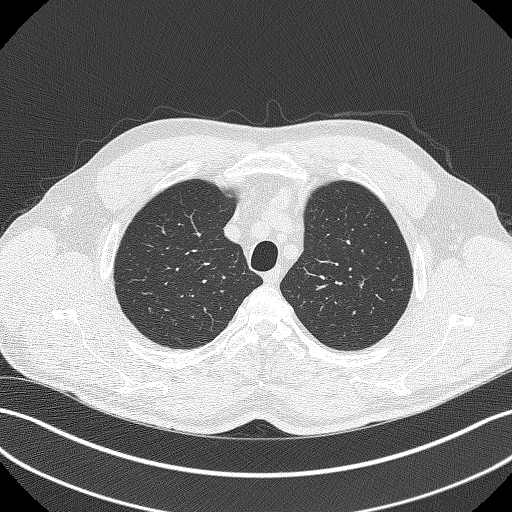
[im 307/356  lung]
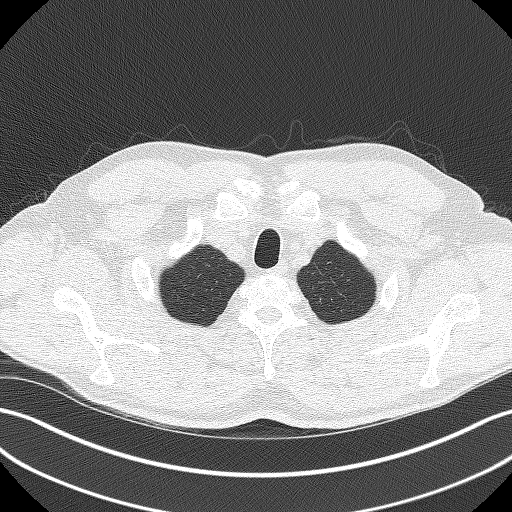
[im 339/356  lung]
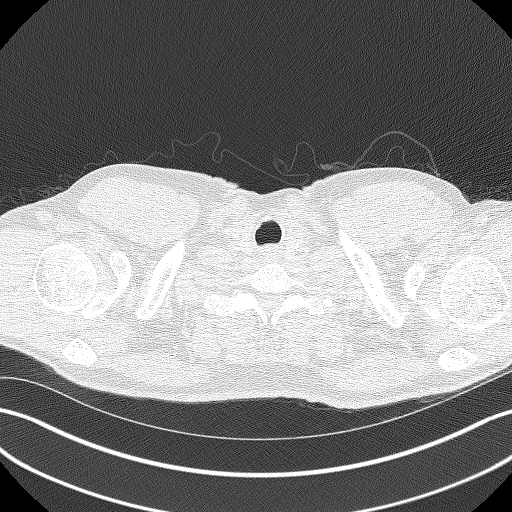

[15 of 40 positions shown; findings below may reference images not displayed]

FINDINGS: Cardiovascular: Coronary artery calcification. Heart size normal. No
pericardial effusion.

Mediastinum/Nodes: No pathologically enlarged mediastinal or
axillary lymph nodes. Hilar regions are difficult to definitively
evaluate without IV contrast but appear grossly unremarkable.
Esophagus is grossly unremarkable.

Lungs/Pleura: Smoking related respiratory bronchiolitis. Mild
central bronchiectasis. Scattered mucoid impaction. No suspicious
pulmonary nodules. No pleural fluid. Airway is unremarkable.

Upper Abdomen: Visualized portions of the liver, gallbladder,
adrenal glands, kidneys, spleen, pancreas, stomach and bowel are
grossly unremarkable.

Musculoskeletal: Degenerative changes in the spine. Mild compression
of the T2 and T3 superior endplates, likely old.
IMPRESSION: 1. Lung-RADS 1, negative. Continue annual screening with low-dose
chest CT without contrast in 12 months.
2. Mild central bronchiectasis.
3. Coronary artery calcification.

## 2023-03-07 ENCOUNTER — Other Ambulatory Visit: Payer: Self-pay | Admitting: Family Medicine

## 2023-03-07 DIAGNOSIS — F5101 Primary insomnia: Secondary | ICD-10-CM

## 2023-03-07 NOTE — Telephone Encounter (Signed)
Requested medication (s) are due for refill today - yes  Requested medication (s) are on the active medication list -yes  Future visit scheduled - no  Last refill: 01/02/23 #30 1RF  Notes to clinic: non delegated Rx  Requested Prescriptions  Pending Prescriptions Disp Refills   ALPRAZolam (XANAX) 1 MG tablet [Pharmacy Med Name: ALPRAZOLAM 1 MG TABLET] 30 tablet 1    Sig: TAKE 1 TABLET BY MOUTH AT BEDTIME AS NEEDED FOR ANXIETY     Not Delegated - Psychiatry: Anxiolytics/Hypnotics 2 Failed - 03/07/2023  8:16 AM      Failed - This refill cannot be delegated      Failed - Urine Drug Screen completed in last 360 days      Failed - Valid encounter within last 6 months    Recent Outpatient Visits           1 year ago Acute bronchitis with COPD ([facility])   [facility] Pickard, Priscille Heidelberg, MD   1 year ago Acute bronchitis with COPD ([facility])   [facility] Pickard, Priscille Heidelberg, MD   1 year ago Wheezing   [facility] Valentino Nose, NP   1 year ago Cough   [facility] Valentino Nose, NP   2 years ago Skin lesion of face   [facility] Pickard, Priscille Heidelberg, MD              Passed - Patient is not pregnant         Requested Prescriptions  Pending Prescriptions Disp Refills   ALPRAZolam (XANAX) 1 MG tablet [Pharmacy Med Name: ALPRAZOLAM 1 MG TABLET] 30 tablet 1    Sig: TAKE 1 TABLET BY MOUTH AT BEDTIME AS NEEDED FOR ANXIETY     Not Delegated - Psychiatry: Anxiolytics/Hypnotics 2 Failed - 03/07/2023  8:16 AM      Failed - This refill cannot be delegated      Failed - Urine Drug Screen completed in last 360 days      Failed - Valid encounter within last 6 months    Recent Outpatient Visits           1 year ago Acute bronchitis with COPD ([facility])   [facility] Donita Brooks, MD   1 year ago Acute bronchitis with COPD (HCC)   [facility] Donita Brooks, MD    1 year ago Wheezing   [facility] Valentino Nose, NP   1 year ago Cough   [facility] Valentino Nose, NP   2 years ago Skin lesion of face   [facility] Pickard, Priscille Heidelberg, MD              Passed - Patient is not pregnant

## 2023-05-05 ENCOUNTER — Other Ambulatory Visit: Payer: Self-pay | Admitting: Family Medicine

## 2023-05-14 ENCOUNTER — Other Ambulatory Visit: Payer: Self-pay | Admitting: Family Medicine

## 2023-05-16 ENCOUNTER — Other Ambulatory Visit: Payer: Self-pay

## 2023-05-16 DIAGNOSIS — F5101 Primary insomnia: Secondary | ICD-10-CM

## 2023-05-16 MED ORDER — ALPRAZOLAM 1 MG PO TABS: ORAL_TABLET | ORAL | 1 refills | Status: DC

## 2023-05-16 NOTE — Telephone Encounter (Signed)
Requested medication (s) are due for refill today: yes  Requested medication (s) are on the active medication list: yes  Last refill:  03/07/23  Future visit scheduled: no  Notes to clinic:  Unable to refill per protocol, cannot delegate.      Requested Prescriptions  Pending Prescriptions Disp Refills   ALPRAZolam (XANAX) 1 MG tablet 30 tablet 1     Not Delegated - Psychiatry: Anxiolytics/Hypnotics 2 Failed - 05/16/2023  9:20 AM      Failed - This refill cannot be delegated      Failed - Urine Drug Screen completed in last 360 days      Failed - Valid encounter within last 6 months    Recent Outpatient Visits           1 year ago Acute bronchitis with COPD (HCC)   [facility] Pickard, Priscille Heidelberg, MD   1 year ago Acute bronchitis with COPD (HCC)   [facility] Donita Brooks, MD   2 years ago Wheezing   [facility] Valentino Nose, NP   2 years ago Cough   [facility] Valentino Nose, NP   2 years ago Skin lesion of face   [facility] Pickard, Priscille Heidelberg, MD              Passed - Patient is not pregnant

## 2023-05-16 NOTE — Telephone Encounter (Signed)
Prescription Request  05/16/2023  LOV: 05/28/22  What is the name of the medication or equipment? ALPRAZolam (XANAX) 1 MG tablet [[id number]]  Have you contacted your pharmacy to request a refill? Yes   Which pharmacy would you like this sent to?  [id number] [place], [place] - [address] [place] [address] Phone: [phone] Fax: [phone]    Patient notified that their request is being sent to the clinical staff for review and that they should receive a response within 2 business days.   Please advise at [facility] [phone]

## 2023-06-08 ENCOUNTER — Other Ambulatory Visit: Payer: Self-pay | Admitting: Family Medicine

## 2023-06-13 ENCOUNTER — Other Ambulatory Visit: Payer: Self-pay

## 2023-06-13 ENCOUNTER — Telehealth: Payer: Self-pay | Admitting: Family Medicine

## 2023-06-13 DIAGNOSIS — E7800 Pure hypercholesterolemia, unspecified: Secondary | ICD-10-CM

## 2023-06-13 MED ORDER — ROSUVASTATIN CALCIUM 10 MG PO TABS: 10.0000 mg | ORAL_TABLET | Freq: Every day | ORAL | 0 refills | Status: DC

## 2023-06-13 NOTE — Telephone Encounter (Signed)
Prescription Request  06/13/2023  LOV: 05/28/2022  What is the name of the medication or equipment?   rosuvastatin (CRESTOR) 10 MG tablet  **90 day script requested. Pharmacy advised patient to call**  Have you contacted your pharmacy to request a refill? Yes   Which pharmacy would you like this sent to?  [id number] [place], [place] - [address] [place] [address] Phone: [phone] Fax: [phone]    Patient notified that their request is being sent to the clinical staff for review and that they should receive a response within 2 business days.   Please advise at [phone]

## 2023-06-16 ENCOUNTER — Encounter: Payer: Self-pay | Admitting: Family Medicine

## 2023-06-16 ENCOUNTER — Ambulatory Visit (INDEPENDENT_AMBULATORY_CARE_PROVIDER_SITE_OTHER): Payer: BC Managed Care – PPO | Admitting: Family Medicine

## 2023-06-16 VITALS — BP 126/82 | HR 79 | Temp 98.6°F | Ht 66.0 in | Wt 185.6 lb

## 2023-06-16 DIAGNOSIS — Z122 Encounter for screening for malignant neoplasm of respiratory organs: Secondary | ICD-10-CM

## 2023-06-16 DIAGNOSIS — E7800 Pure hypercholesterolemia, unspecified: Secondary | ICD-10-CM | POA: Diagnosis not present

## 2023-06-16 DIAGNOSIS — Z87891 Personal history of nicotine dependence: Secondary | ICD-10-CM

## 2023-06-16 DIAGNOSIS — K219 Gastro-esophageal reflux disease without esophagitis: Secondary | ICD-10-CM | POA: Diagnosis not present

## 2023-06-16 DIAGNOSIS — I10 Essential (primary) hypertension: Secondary | ICD-10-CM | POA: Diagnosis not present

## 2023-06-16 DIAGNOSIS — Z125 Encounter for screening for malignant neoplasm of prostate: Secondary | ICD-10-CM | POA: Diagnosis not present

## 2023-06-16 DIAGNOSIS — J42 Unspecified chronic bronchitis: Secondary | ICD-10-CM | POA: Diagnosis not present

## 2023-06-16 DIAGNOSIS — F5101 Primary insomnia: Secondary | ICD-10-CM

## 2023-06-16 LAB — CBC WITH DIFFERENTIAL/PLATELET
Absolute Monocytes: 612 cells/uL (ref 200–950)
Basophils Absolute: 50 cells/uL (ref 0–200)
Basophils Relative: 0.7 %
Eosinophils Absolute: 230 cells/uL (ref 15–500)
Eosinophils Relative: 3.2 %
HCT: 47.1 % (ref 38.5–50.0)
Hemoglobin: 16.2 g/dL (ref 13.2–17.1)
Lymphs Abs: 2038 cells/uL (ref 850–3900)
MCH: 32.8 pg (ref 27.0–33.0)
MCHC: 34.4 g/dL (ref 32.0–36.0)
MCV: 95.3 fL (ref 80.0–100.0)
MPV: 9.5 fL (ref 7.5–12.5)
Monocytes Relative: 8.5 %
Neutro Abs: 4270 cells/uL (ref 1500–7800)
Neutrophils Relative %: 59.3 %
Platelets: 240 10*3/uL (ref 140–400)
RBC: 4.94 10*6/uL (ref 4.20–5.80)
RDW: 12.4 % (ref 11.0–15.0)
Total Lymphocyte: 28.3 %
WBC: 7.2 10*3/uL (ref 3.8–10.8)

## 2023-06-16 MED ORDER — ALPRAZOLAM 1 MG PO TABS
ORAL_TABLET | ORAL | 3 refills | Status: DC
Start: 2023-06-16 — End: 2023-11-20

## 2023-06-16 MED ORDER — PANTOPRAZOLE SODIUM 40 MG PO TBEC
40.0000 mg | DELAYED_RELEASE_TABLET | Freq: Every day | ORAL | 0 refills | Status: DC
Start: 2023-06-16 — End: 2023-10-20

## 2023-06-16 MED ORDER — LOSARTAN POTASSIUM-HCTZ 100-25 MG PO TABS
1.0000 | ORAL_TABLET | Freq: Every day | ORAL | 0 refills | Status: DC
Start: 2023-06-16 — End: 2023-11-25

## 2023-06-16 MED ORDER — ROSUVASTATIN CALCIUM 10 MG PO TABS
10.0000 mg | ORAL_TABLET | Freq: Every day | ORAL | 0 refills | Status: DC
Start: 2023-06-16 — End: 2023-08-19

## 2023-06-16 MED ORDER — ALBUTEROL SULFATE HFA 108 (90 BASE) MCG/ACT IN AERS
INHALATION_SPRAY | RESPIRATORY_TRACT | 3 refills | Status: AC
Start: 2023-06-16 — End: ?

## 2023-06-16 NOTE — Progress Notes (Signed)
Subjective:    Patient ID: Mark Guzman, male    DOB: 24-Mar-1966, 57 y.o.   MRN: [id number] Is a very pleasant 57 year old Caucasian gentleman with a history of hypertension and hyperlipidemia.  He is currently on Hyzaar, 1/2 tablet daily for hypertension.  He reports that his blood pressures are less than 140/90.  He denies any chest pain or shortness of breath or dyspnea on exertion.  He has a 25+ pack year history of smoking.  He quit smoking last year however he is due for lung cancer screening.  His breathing is better to the point that he has stopped Trelegy.  He denies any hemoptysis.  Colonoscopy was performed in 2022 and is up-to-date.  He is due for prostate cancer screening Past Medical History:  Diagnosis Date  . Hypertension    Past Surgical History:  Procedure Laterality Date  . ANKLE SURGERY Left   . COLONOSCOPY WITH PROPOFOL N/A 11/28/2020   Procedure: COLONOSCOPY WITH PROPOFOL;  Surgeon: Corbin Ade, MD;  [facility];  Service: Endoscopy;  Laterality: N/A;  8:00am  . HERNIA REPAIR     57 years old  . nose repair     traumatic injury  . POLYPECTOMY  11/28/2020   Procedure: POLYPECTOMY;  Surgeon: Corbin Ade, MD;  [facility];  Service: Endoscopy;;  . ROTATOR CUFF REPAIR Right 2009   Dr. Teressa Senter   Current Outpatient Medications on File Prior to Visit  Medication Sig Dispense Refill  . ibuprofen (ADVIL) 200 MG tablet Take 400 mg by mouth every 6 (six) hours as needed for mild pain.    Marland Kitchen loratadine (CLARITIN) 10 MG tablet TAKE 1 TABLET BY MOUTH EVERY DAY (Patient taking differently: Take 10 mg by mouth daily as needed for allergies.) 90 tablet 0  . multivitamin-iron-minerals-folic acid (CENTRUM) chewable tablet Chew 1 tablet by mouth daily.     No current facility-administered medications on file prior to visit.   No Known Allergies Social History   Socioeconomic History  . Marital status: Married    Spouse name: Not on file  . Number  of children: Not on file  . Years of education: Not on file  . Highest education level: Not on file  Occupational History  . Not on file  Tobacco Use  . Smoking status: Former    Current packs/day: 1.00    Average packs/day: 1 pack/day for 40.0 years (40.0 ttl pk-yrs)    Types: Cigarettes  . Smokeless tobacco: Never  Vaping Use  . Vaping status: Every Day  Substance and Sexual Activity  . Alcohol use: Yes    Comment: 4-6 beer daily-last use 2 days ago  . Drug use: No  . Sexual activity: Yes  Other Topics Concern  . Not on file  Social History Narrative  . Not on file   Social Determinants of Health   Financial Resource Strain: Not on file  Food Insecurity: Not on file  Transportation Needs: Not on file  Physical Activity: Not on file  Stress: Not on file  Social Connections: Not on file  Intimate Partner Violence: Not on file     Review of Systems  Psychiatric/Behavioral:  The patient has insomnia.   All other systems reviewed and are negative.      Objective:   Physical Exam Vitals reviewed.  Constitutional:      Appearance: Normal appearance. He is well-developed and normal weight.  Neck:     Vascular: No JVD.  Cardiovascular:     Rate and Rhythm: Normal rate and regular rhythm.     Heart sounds: Normal heart sounds.  Pulmonary:     Effort: Pulmonary effort is normal. No respiratory distress.     Breath sounds: Normal breath sounds. No wheezing or rales.  Abdominal:     General: Bowel sounds are normal.     Palpations: Abdomen is soft.  Musculoskeletal:     Cervical back: Neck supple.  Lymphadenopathy:     Cervical: No cervical adenopathy.  Neurological:     Mental Status: He is alert.          Assessment & Plan:  Chronic bronchitis, unspecified chronic bronchitis type (HCC) - Plan: albuterol (VENTOLIN HFA) 108 (90 Base) MCG/ACT inhaler  Benign essential HTN - Plan: losartan-hydrochlorothiazide (HYZAAR) 100-25 MG tablet  Elevated cholesterol  - Plan: rosuvastatin (CRESTOR) 10 MG tablet, CBC with Differential/Platelet, COMPLETE METABOLIC PANEL WITH GFR, Lipid panel  Gastroesophageal reflux disease, unspecified whether esophagitis present - Plan: pantoprazole (PROTONIX) 40 MG tablet  Primary insomnia - Plan: ALPRAZolam (XANAX) 1 MG tablet  Prostate cancer screening - Plan: PSA  Screening for lung cancer - Plan: CT CHEST LUNG CA SCREEN LOW DOSE W/O CM  Former heavy tobacco smoker - Plan: CT CHEST LUNG CA SCREEN LOW DOSE W/O CM I continue to be very proud of the patient for quitting smoking.  He quit Trelegy which is fine as his symptoms are controlled.  I did refill his albuterol that he uses sparingly.  Recommended CT scan of the lung to screen for lung cancer.  Blood pressure today is well-controlled.  Check CBC, CMP, and fasting lipid panel.  Goal LDL cholesterol is less than 100.  I refilled the patient's Xanax that he uses at night to help him sleep.  There is no evidence of abuse or diversion.  I will screen for cancer with PSA

## 2023-07-03 ENCOUNTER — Encounter: Payer: Self-pay | Admitting: Family Medicine

## 2023-07-08 ENCOUNTER — Ambulatory Visit
Admission: RE | Admit: 2023-07-08 | Discharge: 2023-07-08 | Disposition: A | Discharge status: Home / Self Care | Payer: BC Managed Care – PPO | Source: Ambulatory Visit | Attending: Family Medicine | Admitting: Family Medicine

## 2023-07-08 DIAGNOSIS — Z87891 Personal history of nicotine dependence: Secondary | ICD-10-CM | POA: Diagnosis not present

## 2023-07-08 DIAGNOSIS — Z122 Encounter for screening for malignant neoplasm of respiratory organs: Secondary | ICD-10-CM

## 2023-07-14 ENCOUNTER — Telehealth: Payer: Self-pay

## 2023-07-14 ENCOUNTER — Ambulatory Visit
Admission: RE | Admit: 2023-07-14 | Discharge: 2023-07-14 | Disposition: A | Discharge status: Home / Self Care | Payer: BC Managed Care – PPO | Source: Ambulatory Visit | Attending: Family Medicine | Admitting: Family Medicine

## 2023-07-14 ENCOUNTER — Ambulatory Visit: Payer: BC Managed Care – PPO

## 2023-07-14 VITALS — BP 148/100 | HR 98 | Temp 98.0°F | Resp 14

## 2023-07-14 DIAGNOSIS — R0602 Shortness of breath: Secondary | ICD-10-CM

## 2023-07-14 DIAGNOSIS — J441 Chronic obstructive pulmonary disease with (acute) exacerbation: Secondary | ICD-10-CM | POA: Diagnosis not present

## 2023-07-14 DIAGNOSIS — R059 Cough, unspecified: Secondary | ICD-10-CM | POA: Diagnosis not present

## 2023-07-14 MED ORDER — TRELEGY ELLIPTA 100-62.5-25 MCG/ACT IN AEPB: 1.0000 | INHALATION_SPRAY | Freq: Every day | RESPIRATORY_TRACT | 0 refills | Status: DC

## 2023-07-14 MED ORDER — DEXAMETHASONE SODIUM PHOSPHATE 10 MG/ML IJ SOLN
10.0000 mg | Freq: Once | INTRAMUSCULAR | Status: AC
  Administered 2023-07-14: 10 mg via INTRAMUSCULAR

## 2023-07-14 MED ORDER — PROMETHAZINE-DM 6.25-15 MG/5ML PO SYRP: 5.0000 mL | ORAL_SOLUTION | Freq: Four times a day (QID) | ORAL | 0 refills | Status: AC | PRN

## 2023-07-14 MED ORDER — IPRATROPIUM-ALBUTEROL 0.5-2.5 (3) MG/3ML IN SOLN
3.0000 mL | Freq: Once | RESPIRATORY_TRACT | Status: AC
  Administered 2023-07-14: 3 mL via RESPIRATORY_TRACT

## 2023-07-14 MED ORDER — PROMETHAZINE-DM 6.25-15 MG/5ML PO SYRP: 5.0000 mL | ORAL_SOLUTION | Freq: Four times a day (QID) | ORAL | 0 refills | Status: DC | PRN

## 2023-07-14 NOTE — ED Provider Notes (Signed)
[facility]    CSN: [id number] Arrival date & time: 07/14/23  1000      History   Chief Complaint Chief Complaint  Patient presents with   Cough    Entered by patient    HPI Mark Guzman is a 57 y.o. male.   Patient presenting today with about a week of progressively worsening hacking cough, chest tightness, shortness of breath, rib soreness.  Denies fever, chills, congestion, sore throat, abdominal pain, nausea vomiting or diarrhea.  Using albuterol inhaler with minimal relief.  History of COPD, was on Trelegy but stopped that fairly recently.  No known sick contacts recently.    Past Medical History:  Diagnosis Date   Hypertension     Patient Active Problem List   Diagnosis Date Noted   Encounter for colorectal cancer screening 09/12/2020   Insomnia 09/25/2015    Past Surgical History:  Procedure Laterality Date   ANKLE SURGERY Left    COLONOSCOPY WITH PROPOFOL N/A 11/28/2020   Procedure: COLONOSCOPY WITH PROPOFOL;  Surgeon: Corbin Ade, MD;  [facility];  Service: Endoscopy;  Laterality: N/A;  8:00am   HERNIA REPAIR     57 years old   nose repair     traumatic injury   POLYPECTOMY  11/28/2020   Procedure: POLYPECTOMY;  Surgeon: Corbin Ade, MD;  [facility];  Service: Endoscopy;;   ROTATOR CUFF REPAIR Right 2009   Dr. Teressa Senter       Home Medications    Prior to Admission medications   Medication Sig Start Date End Date Taking? Authorizing Provider  albuterol (VENTOLIN HFA) 108 (90 Base) MCG/ACT inhaler INHALE 2 PUFFS INTO THE LUNGS EVERY 4 HOURS AS NEEDED FOR WHEEZING/SHORTNESS OF BREATH/COUGH 06/16/23   Donita Brooks, MD  ALPRAZolam Prudy Feeler) 1 MG tablet TAKE 1 TABLET BY MOUTH AT BEDTIME AS NEEDED FOR ANXIETY 06/16/23   Donita Brooks, MD  Fluticasone-Umeclidin-Vilant (TRELEGY ELLIPTA) 100-62.5-25 MCG/ACT AEPB Inhale 1 puff into the lungs daily. 07/14/23   Particia Nearing, PA-C  ibuprofen (ADVIL) 200 MG  tablet Take 400 mg by mouth every 6 (six) hours as needed for mild pain.    [provider]  loratadine (CLARITIN) 10 MG tablet TAKE 1 TABLET BY MOUTH EVERY DAY Patient taking differently: Take 10 mg by mouth daily as needed for allergies. 09/05/20   [place], Velna Hatchet, MD  losartan-hydrochlorothiazide (HYZAAR) 100-25 MG tablet Take 1 tablet by mouth daily. 06/16/23   Donita Brooks, MD  multivitamin-iron-minerals-folic acid (CENTRUM) chewable tablet Chew 1 tablet by mouth daily.    [provider]  pantoprazole (PROTONIX) 40 MG tablet Take 1 tablet (40 mg total) by mouth daily. 06/16/23   Donita Brooks, MD  promethazine-dextromethorphan (PROMETHAZINE-DM) 6.25-15 MG/5ML syrup Take 5 mLs by mouth 4 (four) times daily as needed. 07/14/23   Particia Nearing, PA-C  rosuvastatin (CRESTOR) 10 MG tablet Take 1 tablet (10 mg total) by mouth daily. 06/16/23   Donita Brooks, MD    Family History Family History  Problem Relation Age of Onset   Arthritis Father    Colon cancer Neg Hx     Social History Social History   Tobacco Use   Smoking status: Former    Current packs/day: 1.00    Average packs/day: 1 pack/day for 40.0 years (40.0 ttl pk-yrs)    Types: Cigarettes   Smokeless tobacco: Never  Vaping Use   Vaping status: Every Day  Substance Use  Topics   Alcohol use: Yes    Comment: 4-6 beer daily-last use 2 days ago   Drug use: No     Allergies   Patient has no known allergies.   Review of Systems Review of Systems Per HPI  Physical Exam Triage Vital Signs ED Triage Vitals  Encounter Vitals Group     BP 07/14/23 1015 (!) 148/100     Systolic BP Percentile --      Diastolic BP Percentile --      Pulse Rate 07/14/23 1015 98     Resp 07/14/23 1015 14     Temp 07/14/23 1015 98 F (36.7 C)     Temp Source 07/14/23 1015 Oral     SpO2 07/14/23 1015 93 %     Weight --      Height --      Head Circumference --      Peak Flow --      Pain Score  07/14/23 1018 3     Pain Loc --      Pain Education --      Exclude from Growth Chart --    No data found.  Updated Vital Signs BP (!) 148/100 (BP Location: Right Arm)   Pulse 98   Temp 98 F (36.7 C) (Oral)   Resp 14   SpO2 93%   Visual Acuity Right Eye Distance:   Left Eye Distance:   Bilateral Distance:    Right Eye Near:   Left Eye Near:    Bilateral Near:     Physical Exam Vitals and nursing note reviewed.  Constitutional:      Appearance: He is well-developed.  HENT:     Head: Atraumatic.     Right Ear: External ear normal.     Left Ear: External ear normal.     Nose: Nose normal.     Mouth/Throat:     Mouth: Mucous membranes are moist.     Pharynx: Oropharynx is clear. Posterior oropharyngeal erythema present. No oropharyngeal exudate.  Eyes:     Conjunctiva/sclera: Conjunctivae normal.     Pupils: Pupils are equal, round, and reactive to light.  Cardiovascular:     Rate and Rhythm: Normal rate and regular rhythm.  Pulmonary:     Effort: Pulmonary effort is normal. No respiratory distress.     Breath sounds: Wheezing present. No rales.  Musculoskeletal:        General: Normal range of motion.     Cervical back: Normal range of motion and neck supple.  Lymphadenopathy:     Cervical: No cervical adenopathy.  Skin:    General: Skin is warm and dry.  Neurological:     Mental Status: He is alert and oriented to person, place, and time.  Psychiatric:        Behavior: Behavior normal.      UC Treatments / Results  Labs (all labs ordered are listed, but only abnormal results are displayed) Labs Reviewed - No data to display  EKG   Radiology DG Chest 2 View  Result Date: 07/14/2023 CLINICAL DATA:  Cough for 1 week. EXAM: CHEST - 2 VIEW COMPARISON:  04/12/2021 FINDINGS: The heart size and mediastinal contours are within normal limits. Both lungs are clear. The visualized skeletal structures are unremarkable. IMPRESSION: No active cardiopulmonary  disease. Electronically Signed   By: Danae Orleans M.D.   On: 07/14/2023 10:47    Procedures Procedures (including critical care time)  Medications Ordered in UC Medications  ipratropium-albuterol (DUONEB) 0.5-2.5 (3) MG/3ML nebulizer solution 3 mL (3 mLs Nebulization Given 07/14/23 1031)  dexamethasone (DECADRON) injection 10 mg (10 mg Intramuscular Given 07/14/23 1218)    Initial Impression / Assessment and Plan / UC Course  I have reviewed the triage vital signs and the nursing notes.  Pertinent labs & imaging results that were available during my care of the patient were reviewed by me and considered in my medical decision making (see chart for details).     Mildly hypertensive at triage but otherwise vital signs reassuring.  DuoNeb done in clinic with mild temporary benefit, x-ray negative for acute cardiopulmonary abnormalities.  Suspect COPD exacerbation, unclear if secondary to viral illness or allergic causes.  Restart Trelegy, continue albuterol as needed, IM Decadron given in clinic and Phenergan DM sent to pharmacy.  Follow-up with PCP as soon as able to recheck and return for worsening symptoms.   Final Clinical Impressions(s) / UC Diagnoses   Final diagnoses:  SOB (shortness of breath)  COPD exacerbation [facility])   Discharge Instructions   None    ED Prescriptions     Medication Sig Dispense Auth. Provider   Fluticasone-Umeclidin-Vilant (TRELEGY ELLIPTA) 100-62.5-25 MCG/ACT AEPB Inhale 1 puff into the lungs daily. 60 each Particia Nearing, PA-C   promethazine-dextromethorphan (PROMETHAZINE-DM) 6.25-15 MG/5ML syrup Take 5 mLs by mouth 4 (four) times daily as needed. 100 mL Particia Nearing, [place]      PDMP not reviewed this encounter.   Particia Nearing, [place] 07/14/23 1243

## 2023-07-14 NOTE — ED Triage Notes (Signed)
Pt reports he has a cough that worsens when he lays down, SOB, chest pain from coughing, rib pain, and headache x 1 week.    Used inhaler but no relief.

## 2023-07-24 ENCOUNTER — Telehealth: Payer: Self-pay | Admitting: Family Medicine

## 2023-07-24 ENCOUNTER — Inpatient Hospital Stay: Payer: BC Managed Care – PPO | Admitting: Family Medicine

## 2023-07-24 NOTE — Telephone Encounter (Signed)
Called and left message for patient to call office. (Nurse Note* Dr Tanya Nones left sample of Trelegy for patient at front desk.)

## 2023-07-24 NOTE — Telephone Encounter (Signed)
I spoke with wife she is aware that their is a sample I have also asked her to call us back in a week to see how he was doing on the medication so we can send in a new prescription.  Sample Lot # [id number] ex 1/26

## 2023-07-24 NOTE — Telephone Encounter (Signed)
Prescription Request Patient was seen by an UC doctor who prescribed him the medication below but they aren't able to get the medication filled due to a PA needing to be done. Patients wife states that he recently came in to the office and seen Dr. Tanya Nones and told him he was no longer taken the medication which was Trelegy 200-62 5-25. Patients wife is asking for a sample of medication while the PA is being completed by our office since UC doctor's don't do PA. She states patient is needing this for his COPD.  Please advise if we can give sample and which dosage and also send in a new prescription.   07/24/2023  LOV: 06/16/2023  What is the name of the medication or equipment? Fluticasone-Umeclidin-Vilant (TRELEGY ELLIPTA) 100-62.5-25 MCG/ACT AEPB   Have you contacted your pharmacy to request a refill? Yes   Which pharmacy would you like this sent to?  [id number] [place], [place] - [address] [place] [address] Phone: [phone] Fax: [phone]    Patient notified that their request is being sent to the clinical staff for review and that they should receive a response within 2 business days.   Please advise at [facility] [phone]

## 2023-08-08 ENCOUNTER — Other Ambulatory Visit: Payer: Self-pay | Admitting: Family Medicine

## 2023-08-08 DIAGNOSIS — E7800 Pure hypercholesterolemia, unspecified: Secondary | ICD-10-CM

## 2023-08-08 NOTE — Telephone Encounter (Signed)
Requested medications are due for refill today.  yes  Requested medications are on the active medications list.  yes  Last refill. 06/16/2023 #30 0 rf  Future visit scheduled.   no  Notes to clinic.  Pt already given a courtesy refill. Please advise.    Requested Prescriptions  Pending Prescriptions Disp Refills   rosuvastatin (CRESTOR) 10 MG tablet [Pharmacy Med Name: ROSUVASTATIN CALCIUM 10 MG TAB] 90 tablet 1    Sig: TAKE 1 TABLET BY MOUTH EVERY DAY     Cardiovascular:  Antilipid - Statins 2 Failed - 08/08/2023  8:30 AM      Failed - Valid encounter within last 12 months    Recent Outpatient Visits           2 years ago Acute bronchitis with COPD (HCC)   [facility] Donita Brooks, MD   2 years ago Acute bronchitis with COPD (HCC)   [facility] Pickard, Priscille Heidelberg, MD   2 years ago Wheezing   [facility] Valentino Nose, NP   2 years ago Cough   [facility] Valentino Nose, NP   2 years ago Skin lesion of face   [facility] Donita Brooks, MD              Failed - Lipid Panel in normal range within the last 12 months    Cholesterol  Date Value Ref Range Status  06/16/2023 147 <200 mg/dL Final   LDL Cholesterol (Calc)  Date Value Ref Range Status  06/16/2023 61 mg/dL (calc) Final    Comment:    Reference range: <100 . Desirable range <100 mg/dL for primary prevention;   <70 mg/dL for patients with CHD or diabetic patients  with > or = 2 CHD risk factors. Marland Kitchen LDL-C is now calculated using the Martin-Hopkins  calculation, which is a validated novel method providing  better accuracy than the Friedewald equation in the  estimation of LDL-C.  Horald Pollen et al. Lenox Ahr. 4098;119(14): 2061-2068  (http://education.[employer].com/faq/FAQ164)    HDL  Date Value Ref Range Status  06/16/2023 62 > OR = 40 mg/dL Final   Triglycerides  Date Value Ref Range Status   06/16/2023 160 (H) <150 mg/dL Final         Passed - Cr in normal range and within 360 days    Creat  Date Value Ref Range Status  06/16/2023 1.12 0.70 - 1.30 mg/dL Final         Passed - Patient is not pregnant

## 2023-08-19 ENCOUNTER — Other Ambulatory Visit: Payer: Self-pay

## 2023-08-19 DIAGNOSIS — E7800 Pure hypercholesterolemia, unspecified: Secondary | ICD-10-CM

## 2023-08-19 MED ORDER — ROSUVASTATIN CALCIUM 10 MG PO TABS
10.0000 mg | ORAL_TABLET | Freq: Every day | ORAL | 1 refills | Status: DC
Start: 2023-08-19 — End: 2024-02-16

## 2023-09-22 ENCOUNTER — Other Ambulatory Visit: Payer: Self-pay | Admitting: Family Medicine

## 2023-10-18 ENCOUNTER — Other Ambulatory Visit: Payer: Self-pay | Admitting: Family Medicine

## 2023-10-18 DIAGNOSIS — K219 Gastro-esophageal reflux disease without esophagitis: Secondary | ICD-10-CM

## 2023-10-20 NOTE — Telephone Encounter (Signed)
Requested Prescriptions  Pending Prescriptions Disp Refills   pantoprazole (PROTONIX) 40 MG tablet [Pharmacy Med Name: PANTOPRAZOLE SOD DR 40 MG TAB] 90 tablet 2    Sig: TAKE 1 TABLET BY MOUTH EVERY DAY     Gastroenterology: Proton Pump Inhibitors Failed - 10/18/2023  9:30 AM      Failed - Valid encounter within last 12 months    Recent Outpatient Visits           2 years ago Acute bronchitis with COPD (HCC)   [facility] Donita Brooks, MD   2 years ago Acute bronchitis with COPD (HCC)   [facility] Donita Brooks, MD   2 years ago Wheezing   [facility] Valentino Nose, NP   2 years ago Cough   [facility] Valentino Nose, NP   3 years ago Skin lesion of face   [facility] Pickard, Priscille Heidelberg, MD

## 2023-11-19 ENCOUNTER — Other Ambulatory Visit: Payer: Self-pay | Admitting: Family Medicine

## 2023-11-19 DIAGNOSIS — F5101 Primary insomnia: Secondary | ICD-10-CM

## 2023-11-24 ENCOUNTER — Other Ambulatory Visit: Payer: Self-pay | Admitting: Family Medicine

## 2023-11-24 NOTE — Telephone Encounter (Signed)
 Prescription Request  11/24/2023  LOV: 06/16/2023  What is the name of the medication or equipment?   Fluticasone-Umeclidin-Vilant (TRELEGY ELLIPTA ) 200-62.5-25 MCG/INH AEPB [[id number]]  DISCONTINUED   Have you contacted your pharmacy to request a refill? Yes   Which pharmacy would you like this sent to?  [id number] [place], [place] - [address] [name] [address] Phone: [phone] Fax: [phone]    Patient notified that their request is being sent to the clinical staff for review and that they should receive a response within 2 business days.   Please advise pharmacist.

## 2023-11-25 ENCOUNTER — Ambulatory Visit: Payer: BC Managed Care – PPO | Admitting: Family Medicine

## 2023-11-25 ENCOUNTER — Encounter: Payer: Self-pay | Admitting: Family Medicine

## 2023-11-25 ENCOUNTER — Telehealth: Payer: Self-pay | Admitting: Family Medicine

## 2023-11-25 VITALS — BP 132/86 | HR 92 | Ht 66.0 in | Wt 185.2 lb

## 2023-11-25 DIAGNOSIS — J441 Chronic obstructive pulmonary disease with (acute) exacerbation: Secondary | ICD-10-CM | POA: Diagnosis not present

## 2023-11-25 DIAGNOSIS — J42 Unspecified chronic bronchitis: Secondary | ICD-10-CM

## 2023-11-25 MED ORDER — TRELEGY ELLIPTA 100-62.5-25 MCG/ACT IN AEPB: 1.0000 | INHALATION_SPRAY | Freq: Every day | RESPIRATORY_TRACT | 11 refills | Status: DC

## 2023-11-25 MED ORDER — PREDNISONE 20 MG PO TABS: ORAL_TABLET | ORAL | 0 refills | Status: AC

## 2023-11-25 NOTE — Progress Notes (Signed)
 Subjective:    Patient ID: Mark Guzman, male    DOB: 1966-10-09, 58 y.o.   MRN: [id number] Patient has a history of chronic bronchitis/COPD.  His breathing had improved to the point that he had stopped Trelegy.  Shortly after Christmas, he developed increasing cough, increasing shortness of breath with activity, wheezing, and diminished air movement.  Today on examination, he has diminished airflow bilaterally.  He has expiratory wheezing.  He denies any chest pain or pleurisy or hemoptysis.  He denies any fevers or chills.  He is not taking his Trelegy. Past Medical History:  Diagnosis Date   Hypertension    Past Surgical History:  Procedure Laterality Date   ANKLE SURGERY Left    COLONOSCOPY WITH PROPOFOL  N/A 11/28/2020   Procedure: COLONOSCOPY WITH PROPOFOL ;  Surgeon: Shaaron Lamar HERO, MD;  [facility];  Service: Endoscopy;  Laterality: N/A;  8:00am   HERNIA REPAIR     58 years old   nose repair     traumatic injury   POLYPECTOMY  11/28/2020   Procedure: POLYPECTOMY;  Surgeon: Shaaron Lamar HERO, MD;  [facility];  Service: Endoscopy;;   ROTATOR CUFF REPAIR Right 2009   Dr. Leonor   Current Outpatient Medications on File Prior to Visit  Medication Sig Dispense Refill   albuterol  (VENTOLIN  HFA) 108 (90 Base) MCG/ACT inhaler INHALE 2 PUFFS INTO THE LUNGS EVERY 4 HOURS AS NEEDED FOR WHEEZING/SHORTNESS OF BREATH/COUGH 8.5 each 3   ALPRAZolam  (XANAX ) 1 MG tablet TAKE 1 TABLET BY MOUTH AT BEDTIME AS NEEDED FOR ANXIETY 30 tablet 2   ibuprofen (ADVIL) 200 MG tablet Take 400 mg by mouth every 6 (six) hours as needed for mild pain.     loratadine  (CLARITIN ) 10 MG tablet TAKE 1 TABLET BY MOUTH EVERY DAY (Patient taking differently: Take 10 mg by mouth daily as needed for allergies.) 90 tablet 0   multivitamin-iron-minerals-folic acid (CENTRUM) chewable tablet Chew 1 tablet by mouth daily.     pantoprazole  (PROTONIX ) 40 MG tablet TAKE 1 TABLET BY MOUTH EVERY DAY 90 tablet 2    rosuvastatin  (CRESTOR ) 10 MG tablet Take 1 tablet (10 mg total) by mouth daily. 90 tablet 1   promethazine -dextromethorphan (PROMETHAZINE -DM) 6.25-15 MG/5ML syrup Take 5 mLs by mouth 4 (four) times daily as needed. (Patient not taking: Reported on 11/25/2023) 100 mL 0   No current facility-administered medications on file prior to visit.   No Known Allergies Social History   Socioeconomic History   Marital status: Married    Spouse name: Not on file   Number of children: Not on file   Years of education: Not on file   Highest education level: Not on file  Occupational History   Not on file  Tobacco Use   Smoking status: Former    Current packs/day: 1.00    Average packs/day: 1 pack/day for 40.0 years (40.0 ttl pk-yrs)    Types: Cigarettes   Smokeless tobacco: Never  Vaping Use   Vaping status: Every Day  Substance and Sexual Activity   Alcohol use: Yes    Comment: 4-6 beer daily-last use 2 days ago   Drug use: No   Sexual activity: Yes  Other Topics Concern   Not on file  Social History Narrative   Not on file   Social Drivers of Health   Financial Resource Strain: Not on file  Food Insecurity: Not on file  Transportation Needs: Not on file  Physical Activity: Not on  file  Stress: Not on file  Social Connections: Not on file  Intimate Partner Violence: Not on file     Review of Systems  All other systems reviewed and are negative.      Objective:   Physical Exam Vitals reviewed.  Constitutional:      Appearance: Normal appearance. He is well-developed and normal weight.  Neck:     Vascular: No JVD.  Cardiovascular:     Rate and Rhythm: Normal rate and regular rhythm.     Heart sounds: Normal heart sounds.  Pulmonary:     Effort: Pulmonary effort is normal. No respiratory distress.     Breath sounds: Decreased air movement present. Decreased breath sounds and wheezing present. No rales.  Abdominal:     General: Bowel sounds are normal.      Palpations: Abdomen is soft.  Musculoskeletal:     Cervical back: Neck supple.  Lymphadenopathy:     Cervical: No cervical adenopathy.  Neurological:     Mental Status: He is alert.           Assessment & Plan:  Chronic bronchitis, unspecified chronic bronchitis type (HCC)  COPD exacerbation (HCC) Patient has COPD/chronic bronchitis.  I believe that he is having an acute exacerbation of this.  Begin a prednisone  taper pack for the acute exacerbation and resume Trelegy 1 puff inhaled daily to prevent this from occurring in the future.  Continue to refrain from smoking

## 2023-11-26 ENCOUNTER — Telehealth: Payer: Self-pay

## 2023-11-26 NOTE — Telephone Encounter (Signed)
 Mark Guzman (Key: [id number])  [employer] has not yet replied to your PA request. Depending on the information you've provided, additional questions may be returned by the plan. You may close this dialog, return to your dashboard, and perform other tasks.  To check for an update later, open this request again from your dashboard.  If [employer] has not replied to your request within 24 hours please contact [employer] at [phone].

## 2024-02-14 ENCOUNTER — Other Ambulatory Visit: Payer: Self-pay | Admitting: Family Medicine

## 2024-02-14 DIAGNOSIS — E7800 Pure hypercholesterolemia, unspecified: Secondary | ICD-10-CM

## 2024-02-16 NOTE — Telephone Encounter (Signed)
 Requested Prescriptions  Pending Prescriptions Disp Refills   rosuvastatin (CRESTOR) 10 MG tablet [Pharmacy Med Name: ROSUVASTATIN CALCIUM 10 MG TAB] 90 tablet 1    Sig: TAKE 1 TABLET BY MOUTH EVERY DAY     Cardiovascular:  Antilipid - Statins 2 Failed - 02/16/2024  2:48 PM      Failed - Lipid Panel in normal range within the last 12 months    Cholesterol  Date Value Ref Range Status  06/16/2023 147 <200 mg/dL Final   LDL Cholesterol (Calc)  Date Value Ref Range Status  06/16/2023 61 mg/dL (calc) Final    Comment:    Reference range: <100 . Desirable range <100 mg/dL for primary prevention;   <70 mg/dL for patients with CHD or diabetic patients  with > or = 2 CHD risk factors. Marland Kitchen LDL-C is now calculated using the Martin-Hopkins  calculation, which is a validated novel method providing  better accuracy than the Friedewald equation in the  estimation of LDL-C.  Horald Pollen et al. Lenox Ahr. 1478;295(62): 2061-2068  (http://education.[facility].com/faq/FAQ164)    HDL  Date Value Ref Range Status  06/16/2023 62 > OR = 40 mg/dL Final   Triglycerides  Date Value Ref Range Status  06/16/2023 160 (H) <150 mg/dL Final         Passed - Cr in normal range and within 360 days    Creat  Date Value Ref Range Status  06/16/2023 1.12 0.70 - 1.30 mg/dL Final         Passed - Patient is not pregnant      Passed - Valid encounter within last 12 months    Recent Outpatient Visits           2 months ago Chronic bronchitis, unspecified chronic bronchitis type [facility])   [place] [facility] Donita Brooks, MD   8 months ago Chronic bronchitis, unspecified chronic bronchitis type [facility])   [place] [facility] Pickard, Priscille Heidelberg, MD   1 year ago Benign essential HTN   [place] [facility] Pickard, Priscille Heidelberg, MD

## 2024-02-19 ENCOUNTER — Other Ambulatory Visit: Payer: Self-pay | Admitting: Family Medicine

## 2024-02-19 DIAGNOSIS — F5101 Primary insomnia: Secondary | ICD-10-CM

## 2024-02-19 NOTE — Telephone Encounter (Signed)
 Requested medication (s) are due for refill today: yes  Requested medication (s) are on the active medication list: yes  Last refill:  11/20/23  Future visit scheduled: no  Notes to clinic:  Unable to refill per protocol, cannot delegate.      Requested Prescriptions  Pending Prescriptions Disp Refills   ALPRAZolam (XANAX) 1 MG tablet [Pharmacy Med Name: ALPRAZOLAM 1 MG TABLET] 30 tablet 2    Sig: TAKE 1 TABLET BY MOUTH AT BEDTIME AS NEEDED FOR ANXIETY     Not Delegated - Psychiatry: Anxiolytics/Hypnotics 2 Failed - 02/19/2024  4:16 PM      Failed - This refill cannot be delegated      Failed - Urine Drug Screen completed in last 360 days      Failed - Valid encounter within last 6 months    Recent Outpatient Visits           2 months ago Chronic bronchitis, unspecified chronic bronchitis type [facility])   [facility] Donita Brooks, MD   8 months ago Chronic bronchitis, unspecified chronic bronchitis type [facility])   [place] [facility] Pickard, Priscille Heidelberg, MD   1 year ago Benign essential HTN    [facility] Pickard, Priscille Heidelberg, MD              Passed - Patient is not pregnant

## 2024-05-18 ENCOUNTER — Other Ambulatory Visit: Payer: Self-pay | Admitting: Family Medicine

## 2024-05-18 DIAGNOSIS — F5101 Primary insomnia: Secondary | ICD-10-CM

## 2024-07-01 ENCOUNTER — Telehealth: Payer: Self-pay | Admitting: Family Medicine

## 2024-07-01 ENCOUNTER — Other Ambulatory Visit: Payer: Self-pay

## 2024-07-01 DIAGNOSIS — K219 Gastro-esophageal reflux disease without esophagitis: Secondary | ICD-10-CM

## 2024-07-01 MED ORDER — PANTOPRAZOLE SODIUM 40 MG PO TBEC
40.0000 mg | DELAYED_RELEASE_TABLET | Freq: Every day | ORAL | 0 refills | Status: DC
Start: 2024-07-01 — End: 2024-10-01

## 2024-07-01 NOTE — Telephone Encounter (Signed)
 Prescription Request  07/01/2024  LOV: 11/25/2023  What is the name of the medication or equipment?   pantoprazole  (PROTONIX ) 40 MG tablet  **90 day script requested**  Have you contacted your pharmacy to request a refill? Yes   Which pharmacy would you like this sent to?  [id number] [address], [place] - [address] [place] [address] Phone: [phone] Fax: [phone]    Patient notified that their request is being sent to the clinical staff for review and that they should receive a response within 2 business days.   Please advise pharmacist.

## 2024-08-16 ENCOUNTER — Other Ambulatory Visit: Payer: Self-pay

## 2024-08-16 ENCOUNTER — Telehealth: Payer: Self-pay

## 2024-08-16 DIAGNOSIS — E7800 Pure hypercholesterolemia, unspecified: Secondary | ICD-10-CM

## 2024-08-16 MED ORDER — ROSUVASTATIN CALCIUM 10 MG PO TABS: 10.0000 mg | ORAL_TABLET | Freq: Every day | ORAL | 0 refills | Status: DC

## 2024-08-16 NOTE — Telephone Encounter (Signed)
 Prescription Request  08/16/2024  LOV: 11/25/23  What is the name of the medication or equipment? rosuvastatin  (CRESTOR ) 10 MG tablet [[id number]]   Have you contacted your pharmacy to request a refill? Yes   Which pharmacy would you like this sent to?  [id number] [place], [place] - [address] [place] [address] Phone: [phone] Fax: [phone]    Patient notified that their request is being sent to the clinical staff for review and that they should receive a response within 2 business days.   Please advise at [facility] [phone]

## 2024-08-20 ENCOUNTER — Other Ambulatory Visit: Payer: Self-pay | Admitting: Family Medicine

## 2024-08-20 DIAGNOSIS — F5101 Primary insomnia: Secondary | ICD-10-CM

## 2024-09-02 ENCOUNTER — Ambulatory Visit: Admitting: Family Medicine

## 2024-09-02 ENCOUNTER — Encounter: Payer: Self-pay | Admitting: Family Medicine

## 2024-09-02 VITALS — BP 148/82 | HR 102 | Temp 98.2°F | Ht 66.0 in | Wt 199.6 lb

## 2024-09-02 DIAGNOSIS — I10 Essential (primary) hypertension: Secondary | ICD-10-CM | POA: Diagnosis not present

## 2024-09-02 DIAGNOSIS — Z125 Encounter for screening for malignant neoplasm of prostate: Secondary | ICD-10-CM | POA: Diagnosis not present

## 2024-09-02 DIAGNOSIS — Z23 Encounter for immunization: Secondary | ICD-10-CM | POA: Diagnosis not present

## 2024-09-02 NOTE — Addendum Note (Signed)
 Addended by: ANGELENA RONAL BRADLEY K on: 09/02/2024 11:50 AM   Modules accepted: Orders

## 2024-09-02 NOTE — Progress Notes (Signed)
 Subjective:    Patient ID: Mark Guzman, male    DOB: [birth year], 58 y.o.   MRN: [id number] Patient is here today for his normal checkup.  His blood pressure is elevated at 148/82.  However he has whitecoat syndrome.  He presents results showing that he checks his blood pressure daily and that his systolic blood pressure is typically 119-126/80-82.  He denies any chest pain or shortness of breath.  Yesterday he was lifting a heavy freezer.  Shortly thereafter he developed trace bright red blood per rectum.  I performed a rectal exam today.  He was guaiac negative.  There was no obvious rectal mass.  The patient did have pain with rectal exam due to a tight anal sphincter.  He was unable to relax.  I did not see any visible fissure.  However I suspect that the bright red blood per rectum could have been due to an internal hemorrhoid versus fissure likely related to lifting heavy weight.  He has not seen any additional blood.  He had a colonoscopy that was negative in 2022. Past Medical History:  Diagnosis Date   Hypertension    Past Surgical History:  Procedure Laterality Date   ANKLE SURGERY Left    COLONOSCOPY WITH PROPOFOL  N/A 11/28/2020   Procedure: COLONOSCOPY WITH PROPOFOL ;  Surgeon: Shaaron Lamar HERO, MD;  Location: [facility];  Service: Endoscopy;  Laterality: N/A;  8:00am   HERNIA REPAIR     58 years old   nose repair     traumatic injury   POLYPECTOMY  11/28/2020   Procedure: POLYPECTOMY;  Surgeon: Shaaron Lamar HERO, MD;  Location: [facility];  Service: Endoscopy;;   ROTATOR CUFF REPAIR Right 2009   Dr. Leonor   Current Outpatient Medications on File Prior to Visit  Medication Sig Dispense Refill   albuterol  (VENTOLIN  HFA) 108 (90 Base) MCG/ACT inhaler INHALE 2 PUFFS INTO THE LUNGS EVERY 4 HOURS AS NEEDED FOR WHEEZING/SHORTNESS OF BREATH/COUGH 8.5 each 3   ALPRAZolam  (XANAX ) 1 MG tablet TAKE 1 TABLET BY MOUTH AT BEDTIME AS NEEDED FOR ANXIETY 30 tablet 1    Fluticasone-Umeclidin-Vilant (TRELEGY ELLIPTA ) 100-62.5-25 MCG/ACT AEPB Inhale 1 puff into the lungs daily. 60 each 11   ibuprofen (ADVIL) 200 MG tablet Take 400 mg by mouth every 6 (six) hours as needed for mild pain.     multivitamin-iron-minerals-folic acid (CENTRUM) chewable tablet Chew 1 tablet by mouth daily.     pantoprazole  (PROTONIX ) 40 MG tablet Take 1 tablet (40 mg total) by mouth daily. APPOINTMENT NEEDED IN OFFICE FOR FURTHER REFILLS 90 tablet 0   rosuvastatin  (CRESTOR ) 10 MG tablet Take 1 tablet (10 mg total) by mouth daily. 90 tablet 0   loratadine  (CLARITIN ) 10 MG tablet TAKE 1 TABLET BY MOUTH EVERY DAY (Patient not taking: Reported on 09/02/2024) 90 tablet 0   predniSONE  (DELTASONE ) 20 MG tablet 3 tabs poqday 1-2, 2 tabs poqday 3-4, 1 tab poqday 5-6 (Patient not taking: Reported on 09/02/2024) 12 tablet 0   promethazine -dextromethorphan (PROMETHAZINE -DM) 6.25-15 MG/5ML syrup Take 5 mLs by mouth 4 (four) times daily as needed. (Patient not taking: Reported on 09/02/2024) 100 mL 0   No current facility-administered medications on file prior to visit.   No Known Allergies Social History   Socioeconomic History   Marital status: Married    Spouse name: Not on file   Number of children: Not on file   Years of education: Not on file   Highest education level: Not  on file  Occupational History   Not on file  Tobacco Use   Smoking status: Former    Current packs/day: 1.00    Average packs/day: 1 pack/day for 40.0 years (40.0 ttl pk-yrs)    Types: Cigarettes   Smokeless tobacco: Never  Vaping Use   Vaping status: Every Day  Substance and Sexual Activity   Alcohol use: Yes    Comment: 4-6 beer daily-last use 2 days ago   Drug use: No   Sexual activity: Yes  Other Topics Concern   Not on file  Social History Narrative   Not on file   Social Drivers of Health   Financial Resource Strain: Not on file  Food Insecurity: Not on file  Transportation Needs: Not on file   Physical Activity: Not on file  Stress: Not on file  Social Connections: Not on file  Intimate Partner Violence: Not on file     Review of Systems  All other systems reviewed and are negative.      Objective:   Physical Exam Vitals reviewed.  Constitutional:      Appearance: Normal appearance. He is well-developed and normal weight.  Neck:     Vascular: No JVD.  Cardiovascular:     Rate and Rhythm: Normal rate and regular rhythm.     Heart sounds: Normal heart sounds.  Pulmonary:     Effort: Pulmonary effort is normal. No respiratory distress.     Breath sounds: Decreased air movement present. Decreased breath sounds and wheezing present. No rales.  Abdominal:     General: Bowel sounds are normal.     Palpations: Abdomen is soft.  Genitourinary:    Rectum: Guaiac result negative. Tenderness present. No mass or external hemorrhoid. Abnormal anal tone.  Musculoskeletal:     Cervical back: Neck supple.  Lymphadenopathy:     Cervical: No cervical adenopathy.  Neurological:     Mental Status: He is alert.           Assessment & Plan:  Prostate cancer screening - Plan: PSA  Benign essential HTN - Plan: CBC with Differential/Platelet, Comprehensive metabolic panel with GFR, Lipid panel Patient's blood pressure today is elevated but I believe this is due to whitecoat syndrome.  His home blood pressure is well-controlled.  I will check a CBC a CMP and a lipid panel.  I would like his LDL cholesterol to be less than 100.  I will check a PSA to screen for prostate cancer.  Patient received his flu shot today.  I believe the trace bright red blood per rectum is likely due to either to an internal hemorrhoid or fissure.  Rectal exam was reassuring today.  If blood persist I would recommend sigmoidoscopy

## 2024-09-03 ENCOUNTER — Ambulatory Visit: Payer: Self-pay | Admitting: Family Medicine

## 2024-09-03 LAB — COMPREHENSIVE METABOLIC PANEL WITH GFR
AG Ratio: 2.9 (calc) — ABNORMAL HIGH (ref 1.0–2.5)
ALT: 54 U/L — ABNORMAL HIGH (ref 9–46)
AST: 28 U/L (ref 10–35)
Albumin: 4.9 g/dL (ref 3.6–5.1)
Alkaline phosphatase (APISO): 67 U/L (ref 35–144)
BUN: 18 mg/dL (ref 7–25)
CO2: 27 mmol/L (ref 20–32)
Calcium: 10.1 mg/dL (ref 8.6–10.3)
Chloride: 105 mmol/L (ref 98–110)
Creat: 1.02 mg/dL (ref 0.70–1.30)
Globulin: 1.7 g/dL — ABNORMAL LOW (ref 1.9–3.7)
Glucose, Bld: 109 mg/dL — ABNORMAL HIGH (ref 65–99)
Potassium: 4.9 mmol/L (ref 3.5–5.3)
Sodium: 140 mmol/L (ref 135–146)
Total Bilirubin: 0.5 mg/dL (ref 0.2–1.2)
Total Protein: 6.6 g/dL (ref 6.1–8.1)
eGFR: 85 mL/min/1.73m2 (ref >=60)

## 2024-09-03 LAB — CBC WITH DIFFERENTIAL/PLATELET
Absolute Lymphocytes: 1843 {cells}/uL (ref 850–3900)
Absolute Monocytes: 524 {cells}/uL (ref 200–950)
Basophils Absolute: 48 {cells}/uL (ref 0–200)
Basophils Relative: 0.7 %
Eosinophils Absolute: 313 {cells}/uL (ref 15–500)
Eosinophils Relative: 4.6 %
HCT: 46.9 % (ref 38.5–50.0)
Hemoglobin: 16.2 g/dL (ref 13.2–17.1)
MCH: 32.4 pg (ref 27.0–33.0)
MCHC: 34.5 g/dL (ref 32.0–36.0)
MCV: 93.8 fL (ref 80.0–100.0)
MPV: 9.5 fL (ref 7.5–12.5)
Monocytes Relative: 7.7 %
Neutro Abs: 4073 {cells}/uL (ref 1500–7800)
Neutrophils Relative %: 59.9 %
Platelets: 241 Thousand/uL (ref 140–400)
RBC: 5 Million/uL (ref 4.20–5.80)
RDW: 12.5 % (ref 11.0–15.0)
Total Lymphocyte: 27.1 %
WBC: 6.8 Thousand/uL (ref 3.8–10.8)

## 2024-09-03 LAB — PSA: PSA: 0.8 ng/mL (ref <=4.00)

## 2024-09-03 LAB — LIPID PANEL
Cholesterol: 144 mg/dL (ref <200)
HDL: 52 mg/dL (ref >=40)
LDL Cholesterol (Calc): 78 mg/dL
Non-HDL Cholesterol (Calc): 92 mg/dL (ref <130)
Total CHOL/HDL Ratio: 2.8 (calc) (ref <5.0)
Triglycerides: 67 mg/dL (ref <150)

## 2024-09-29 ENCOUNTER — Other Ambulatory Visit: Payer: Self-pay | Admitting: Family Medicine

## 2024-09-29 DIAGNOSIS — K219 Gastro-esophageal reflux disease without esophagitis: Secondary | ICD-10-CM

## 2024-10-06 ENCOUNTER — Ambulatory Visit (INDEPENDENT_AMBULATORY_CARE_PROVIDER_SITE_OTHER)

## 2024-10-06 DIAGNOSIS — Z23 Encounter for immunization: Secondary | ICD-10-CM | POA: Diagnosis not present

## 2024-10-06 NOTE — Progress Notes (Signed)
 Patient is in office today for a nurse visit for Immunization. Patient Injection was given in the  Left deltoid. Patient tolerated injection well.

## 2024-10-23 ENCOUNTER — Other Ambulatory Visit: Payer: Self-pay | Admitting: Family Medicine

## 2024-10-23 DIAGNOSIS — F5101 Primary insomnia: Secondary | ICD-10-CM

## 2024-11-17 ENCOUNTER — Telehealth: Payer: Self-pay | Admitting: Pharmacy Technician

## 2024-11-17 ENCOUNTER — Other Ambulatory Visit: Payer: Self-pay | Admitting: Family Medicine

## 2024-11-17 ENCOUNTER — Other Ambulatory Visit (HOSPITAL_COMMUNITY): Payer: Self-pay

## 2024-11-17 DIAGNOSIS — E7800 Pure hypercholesterolemia, unspecified: Secondary | ICD-10-CM

## 2024-11-17 NOTE — Telephone Encounter (Signed)
 Pharmacy Patient Advocate Encounter   Received notification from [employer] that prior authorization for Trelegy Ellipta  100-62.5-25MCG/ACT aerosol powder is due for renewal.   Insurance verification completed.   The patient is insured through [facility].  Action: PA required; PA started via CoverMyMeds. KEY [id number] . Waiting for clinical questions to populate.

## 2024-11-18 ENCOUNTER — Other Ambulatory Visit (HOSPITAL_COMMUNITY): Payer: Self-pay

## 2024-11-19 ENCOUNTER — Other Ambulatory Visit (HOSPITAL_COMMUNITY): Payer: Self-pay

## 2024-11-19 NOTE — Telephone Encounter (Signed)
 Pharmacy Patient Advocate Encounter  Received notification from [employer] [facility] that Prior Authorization for Trelegy Ellipta  100-62.5-25MCG/ACT aerosol powder has been DENIED.  Full denial letter will be uploaded to the media tab. See denial reason below.    PA #/Case ID/Reference #: Denial letter has been uploaded to his media tab

## 2024-11-24 ENCOUNTER — Telehealth: Payer: Self-pay

## 2024-11-24 NOTE — Telephone Encounter (Signed)
 Copied from CRM [id number]. Topic: Clinical - Medication Prior Auth >> Nov 24, 2024  3:05 PM Donna BRAVO wrote: Reason for CRM:  Mark Guzman stated Fluticasone-Umeclidin-Vilant (TRELEGY ELLIPTA ) 100-62.5-25 MCG/ACT AEPB  has been denied on 11/18/24 and sent a fax same day to this fax # [phone]  Mark Guzman will refax to [phone]

## 2024-11-25 ENCOUNTER — Other Ambulatory Visit (HOSPITAL_COMMUNITY): Payer: Self-pay

## 2024-12-01 NOTE — Telephone Encounter (Signed)
 Fax received from [facility] and they have denied Trelegy for this patient. Thanks.

## 2024-12-02 ENCOUNTER — Other Ambulatory Visit: Payer: Self-pay

## 2024-12-02 DIAGNOSIS — J42 Unspecified chronic bronchitis: Secondary | ICD-10-CM

## 2024-12-02 DIAGNOSIS — J441 Chronic obstructive pulmonary disease with (acute) exacerbation: Secondary | ICD-10-CM

## 2024-12-02 MED ORDER — FLUTICASONE-SALMETEROL 250-50 MCG/ACT IN AEPB: 1.0000 | INHALATION_SPRAY | Freq: Two times a day (BID) | RESPIRATORY_TRACT | 3 refills | Status: AC

## 2024-12-10 ENCOUNTER — Ambulatory Visit: Admitting: Family Medicine

## 2024-12-10 DIAGNOSIS — Z23 Encounter for immunization: Secondary | ICD-10-CM | POA: Diagnosis not present

## 2024-12-10 NOTE — Progress Notes (Signed)
 Patient is in office today for a nurse visit for Shingles # 2 Immunization. Patient Injection was given in the  Left deltoid. Patient tolerated injection well.
